# Patient Record
Sex: Female | Born: 1975 | Race: Black or African American | Hispanic: No | Marital: Single | Smoking: Never smoker
Health system: Southern US, Community
[De-identification: ages and names within clinical notes are randomized; demographics above are authoritative.]

## PROBLEM LIST (undated history)

## (undated) DIAGNOSIS — E079 Disorder of thyroid, unspecified: Secondary | ICD-10-CM

## (undated) DIAGNOSIS — K219 Gastro-esophageal reflux disease without esophagitis: Secondary | ICD-10-CM

## (undated) DIAGNOSIS — R51 Headache: Secondary | ICD-10-CM

## (undated) DIAGNOSIS — T7840XA Allergy, unspecified, initial encounter: Secondary | ICD-10-CM

## (undated) DIAGNOSIS — G8929 Other chronic pain: Secondary | ICD-10-CM

## (undated) DIAGNOSIS — R7303 Prediabetes: Secondary | ICD-10-CM

## (undated) DIAGNOSIS — Z8619 Personal history of other infectious and parasitic diseases: Secondary | ICD-10-CM

## (undated) DIAGNOSIS — D649 Anemia, unspecified: Secondary | ICD-10-CM

## (undated) DIAGNOSIS — M719 Bursopathy, unspecified: Secondary | ICD-10-CM

## (undated) HISTORY — DX: Other chronic pain: G89.29

## (undated) HISTORY — DX: Personal history of other infectious and parasitic diseases: Z86.19

## (undated) HISTORY — DX: Disorder of thyroid, unspecified: E07.9

## (undated) HISTORY — DX: Allergy, unspecified, initial encounter: T78.40XA

## (undated) HISTORY — PX: KELOID EXCISION: SHX1856

## (undated) HISTORY — DX: Headache: R51

## (undated) HISTORY — DX: Bursopathy, unspecified: M71.9

## (undated) HISTORY — DX: Prediabetes: R73.03

## (undated) HISTORY — DX: Gastro-esophageal reflux disease without esophagitis: K21.9

## (undated) HISTORY — DX: Anemia, unspecified: D64.9

---

## 2000-09-07 ENCOUNTER — Emergency Department (HOSPITAL_COMMUNITY): Admission: EM | Admit: 2000-09-07 | Discharge: 2000-09-07 | Payer: Self-pay | Admitting: *Deleted

## 2000-09-23 ENCOUNTER — Encounter: Payer: Self-pay | Admitting: Emergency Medicine

## 2000-09-23 ENCOUNTER — Emergency Department (HOSPITAL_COMMUNITY): Admission: EM | Admit: 2000-09-23 | Discharge: 2000-09-23 | Payer: Self-pay | Admitting: Internal Medicine

## 2004-12-31 ENCOUNTER — Ambulatory Visit: Payer: Self-pay | Admitting: Internal Medicine

## 2005-11-07 HISTORY — PX: BREAST REDUCTION SURGERY: SHX8

## 2005-11-10 ENCOUNTER — Ambulatory Visit (HOSPITAL_BASED_OUTPATIENT_CLINIC_OR_DEPARTMENT_OTHER): Admission: RE | Admit: 2005-11-10 | Discharge: 2005-11-11 | Payer: Self-pay | Admitting: Specialist

## 2005-11-10 ENCOUNTER — Encounter (INDEPENDENT_AMBULATORY_CARE_PROVIDER_SITE_OTHER): Payer: Self-pay | Admitting: Specialist

## 2006-01-12 ENCOUNTER — Encounter: Admission: RE | Admit: 2006-01-12 | Discharge: 2006-01-12 | Payer: Self-pay | Admitting: Emergency Medicine

## 2007-05-10 HISTORY — PX: FOOT SURGERY: SHX648

## 2007-05-24 ENCOUNTER — Ambulatory Visit (HOSPITAL_COMMUNITY): Admission: RE | Admit: 2007-05-24 | Discharge: 2007-05-24 | Payer: Self-pay | Admitting: Specialist

## 2008-02-10 ENCOUNTER — Emergency Department (HOSPITAL_BASED_OUTPATIENT_CLINIC_OR_DEPARTMENT_OTHER): Admission: EM | Admit: 2008-02-10 | Discharge: 2008-02-10 | Payer: Self-pay | Admitting: Emergency Medicine

## 2008-02-21 ENCOUNTER — Emergency Department (HOSPITAL_BASED_OUTPATIENT_CLINIC_OR_DEPARTMENT_OTHER): Admission: EM | Admit: 2008-02-21 | Discharge: 2008-02-21 | Payer: Self-pay | Admitting: Emergency Medicine

## 2008-08-04 ENCOUNTER — Ambulatory Visit: Payer: Self-pay | Admitting: *Deleted

## 2008-08-04 ENCOUNTER — Other Ambulatory Visit: Admission: RE | Admit: 2008-08-04 | Discharge: 2008-08-04 | Payer: Self-pay | Admitting: *Deleted

## 2008-08-04 ENCOUNTER — Encounter (INDEPENDENT_AMBULATORY_CARE_PROVIDER_SITE_OTHER): Payer: Self-pay | Admitting: *Deleted

## 2008-08-04 DIAGNOSIS — K921 Melena: Secondary | ICD-10-CM | POA: Insufficient documentation

## 2008-08-04 DIAGNOSIS — K219 Gastro-esophageal reflux disease without esophagitis: Secondary | ICD-10-CM

## 2008-08-04 DIAGNOSIS — J301 Allergic rhinitis due to pollen: Secondary | ICD-10-CM | POA: Insufficient documentation

## 2008-08-04 DIAGNOSIS — M76899 Other specified enthesopathies of unspecified lower limb, excluding foot: Secondary | ICD-10-CM

## 2008-08-04 DIAGNOSIS — D649 Anemia, unspecified: Secondary | ICD-10-CM | POA: Insufficient documentation

## 2008-08-04 DIAGNOSIS — K5900 Constipation, unspecified: Secondary | ICD-10-CM | POA: Insufficient documentation

## 2008-08-04 LAB — CONVERTED CEMR LAB
ALT: 15 units/L (ref 0–35)
Basophils Absolute: 0.1 10*3/uL (ref 0.0–0.1)
Basophils Relative: 1.8 % (ref 0.0–3.0)
CO2: 26 meq/L (ref 19–32)
Calcium: 9.2 mg/dL (ref 8.4–10.5)
Chloride: 105 meq/L (ref 96–112)
Cholesterol: 153 mg/dL (ref 0–200)
GFR calc Af Amer: 125 mL/min
LDL Cholesterol: 86 mg/dL (ref 0–99)
Lymphocytes Relative: 37.5 % (ref 12.0–46.0)
MCHC: 32.8 g/dL (ref 30.0–36.0)
Monocytes Relative: 7 % (ref 3.0–12.0)
Neutrophils Relative %: 49.7 % (ref 43.0–77.0)
RBC: 4.2 M/uL (ref 3.87–5.11)
RDW: 15 % — ABNORMAL HIGH (ref 11.5–14.6)
Sodium: 137 meq/L (ref 135–145)
Total CHOL/HDL Ratio: 2.8
Total Protein: 7.7 g/dL (ref 6.0–8.3)
VLDL: 12 mg/dL (ref 0–40)

## 2008-08-25 ENCOUNTER — Ambulatory Visit: Payer: Self-pay | Admitting: *Deleted

## 2008-08-29 ENCOUNTER — Telehealth (INDEPENDENT_AMBULATORY_CARE_PROVIDER_SITE_OTHER): Payer: Self-pay | Admitting: *Deleted

## 2008-08-30 ENCOUNTER — Encounter (INDEPENDENT_AMBULATORY_CARE_PROVIDER_SITE_OTHER): Payer: Self-pay | Admitting: *Deleted

## 2008-09-21 DIAGNOSIS — R51 Headache: Secondary | ICD-10-CM

## 2008-09-21 DIAGNOSIS — R519 Headache, unspecified: Secondary | ICD-10-CM | POA: Insufficient documentation

## 2008-09-26 ENCOUNTER — Encounter: Payer: Self-pay | Admitting: Internal Medicine

## 2008-11-25 ENCOUNTER — Ambulatory Visit (HOSPITAL_BASED_OUTPATIENT_CLINIC_OR_DEPARTMENT_OTHER): Admission: RE | Admit: 2008-11-25 | Discharge: 2008-11-25 | Payer: Self-pay | Admitting: Emergency Medicine

## 2009-02-27 ENCOUNTER — Ambulatory Visit: Payer: Self-pay | Admitting: Internal Medicine

## 2009-02-27 DIAGNOSIS — J018 Other acute sinusitis: Secondary | ICD-10-CM

## 2009-08-13 ENCOUNTER — Other Ambulatory Visit: Admission: RE | Admit: 2009-08-13 | Discharge: 2009-08-13 | Payer: Self-pay | Admitting: Internal Medicine

## 2009-08-13 ENCOUNTER — Ambulatory Visit: Payer: Self-pay | Admitting: Internal Medicine

## 2009-08-13 DIAGNOSIS — E669 Obesity, unspecified: Secondary | ICD-10-CM

## 2009-08-13 DIAGNOSIS — R439 Unspecified disturbances of smell and taste: Secondary | ICD-10-CM

## 2009-08-14 ENCOUNTER — Ambulatory Visit: Payer: Self-pay | Admitting: Diagnostic Radiology

## 2009-08-14 ENCOUNTER — Ambulatory Visit (HOSPITAL_BASED_OUTPATIENT_CLINIC_OR_DEPARTMENT_OTHER): Admission: RE | Admit: 2009-08-14 | Discharge: 2009-08-14 | Payer: Self-pay | Admitting: Internal Medicine

## 2009-08-14 ENCOUNTER — Encounter (INDEPENDENT_AMBULATORY_CARE_PROVIDER_SITE_OTHER): Payer: Self-pay | Admitting: *Deleted

## 2009-08-16 ENCOUNTER — Encounter: Payer: Self-pay | Admitting: Internal Medicine

## 2009-08-16 ENCOUNTER — Telehealth: Payer: Self-pay | Admitting: Internal Medicine

## 2009-08-31 ENCOUNTER — Encounter: Payer: Self-pay | Admitting: Internal Medicine

## 2009-09-07 ENCOUNTER — Encounter: Admission: RE | Admit: 2009-09-07 | Discharge: 2009-09-07 | Payer: Self-pay | Admitting: Otolaryngology

## 2010-02-13 ENCOUNTER — Ambulatory Visit: Payer: Self-pay | Admitting: Internal Medicine

## 2010-07-09 NOTE — Consult Note (Signed)
Summary: [facility]  [facility]   Imported By: Lanelle Bal 09/11/2009 12:46:38  _____________________________________________________________________  External Attachment:    Type:   Image     Comment:   External Document

## 2010-07-09 NOTE — Letter (Signed)
Summary: Primary Care Consult Scheduled Letter  [place] at [facility]  [address]   [place], [place] [address]   Phone: [phone]  Fax: [phone]      08/14/2009 MRN: [id number]  [facility] [place] [address] [place], [place]  [address]    Dear Ms. Elsberry,      We have scheduled an appointment for you.  At the recommendation of Dr._YOO, we have scheduled you a consult with [facility] , DR Conley Simmonds  on _[address]  at [facility] .  Their address is_[address], [place] . The office phone number is [phone].  If this appointment day and time is not convenient for you, please feel free to call the office of the doctor you are being referred to at the number listed above and reschedule the appointment.     It is important for you to keep your scheduled appointments. We are here to make sure you are given good patient care. If you have questions or you have made changes to your appointment, please notify us at  [phone], ask for HELEN.    Thank you,  Patient Care Coordinator [place] at [facility]

## 2010-07-09 NOTE — Progress Notes (Signed)
Summary: Lab Results   Phone Note Outgoing Call   Summary of Call: call pt - HIV is negative.   Please have pt complete regular stools cards re:  anemia.  285.9 Initial call taken by: D. Thomos Lemons DO,  August 16, 2009 4:27 PM  Follow-up for Phone Call        patient advised per Dr Artist Pais instructions, Stool card mailed to patient per her request Follow-up by: Glendell Docker CMA,  August 17, 2009 8:20 AM

## 2010-07-09 NOTE — Letter (Signed)
   [place] at [facility] [address] [place], [place]  [address]  [place] Phone: [phone]      August 16, 2009   [facility] [place] [address] [place], [place] [address]  RE:  LAB RESULTS  Dear  Ms. Lehrman,  The following is an interpretation of your most recent lab tests.  Please take note of any instructions provided or changes to medications that have resulted from your lab work.  Pap Smear: normal - follow up in 1 year  ELECTROLYTES:  Good - no changes needed  KIDNEY FUNCTION TESTS:  Good - no changes needed  LIVER FUNCTION TESTS:  Good - no changes needed  LIPID PANEL:  Good - no changes needed Triglyceride: 46   Cholesterol: 140   LDL: 70   HDL: 61   Chol/HDL%:  2.3 Ratio  THYROID STUDIES:  Thyroid studies normal TSH: 1.161     CBC:  Fair - review at your next visit  Please take over the counter  iron supplement daily.      Sincerely Yours,    Dr. Thomos Lemons

## 2010-07-09 NOTE — Assessment & Plan Note (Signed)
Summary: NEW TO EST.npx (FASTING )Dr. Andrey Campanile pt- jr   Vital Signs:  Patient profile:   35 year old female Height:      66.5 inches Weight:      255.75 pounds BMI:     40.81 O2 Sat:      100 % on Room air Temp:     98.8 degrees F oral Pulse rate:   81 / minute Pulse rhythm:   regular Resp:     18 per minute BP sitting:   108 / 72  (right arm) Cuff size:   large  Vitals Entered By: Glendell Docker CMA (August 13, 2009 11:06 AM)  O2 Flow:  Room air CC: CPX Is Patient Diabetic? No Comments refill on Tri Sprintec   Primary Care Provider:  Dondra Spry DO  CC:  CPX.  History of Present Illness: 35 y/o AA female for routine CPX.  works during day and goes to school at night gave up soft drinks x 1 month trying to cut out sweets and chips  she had sinus infection in 02/2009.  infection cleared up but she lost her sense of smell and it never returned to normal.  no current sinus symptoms.  no headaches. her sense of taste  also abnormal.    LMP 2 wks ago no hx of abnormal paps no vaginal symptoms hx of breast reduction.  chronic "lumpy" breasts  Preventive Screening-Counseling & Management  Alcohol-Tobacco     Alcohol drinks/day: 0     Smoking Status: never  Caffeine-Diet-Exercise     Caffeine use/day: 1-2 per week     Does Patient Exercise: no  Allergies (verified): No Known Drug Allergies  Past History:  Past Medical History: Current Problems:  ALLERGIC RHINITIS, SEASONAL (ICD-477.0) GERD (ICD-530.81) CHICKENPOX (ICD-052.9)   ANEMIA (ICD-285.9) - past history  Bursitis - bilateral hip R>L - sees ortho  (uses tramadol and ibuprofen constipation  Family History: Family History of Arthritis Family History Diabetes 1st degree relative - father Family History High cholesterol Family History Hypertension Family History of  CAD - father Family History of  "female  - GM had ovarian cancer No fam hx of breast cancer    Social History: Occupation:  Admission  service associate - [employer] Single lives with god sister no children Never Smoked  Alcohol use-no Caffeine use/day:  1-2 per week Does Patient Exercise:  no  Review of Systems       The patient complains of weight gain.  The patient denies chest pain, dyspnea on exertion, prolonged cough, abdominal pain, melena, hematochezia, severe indigestion/heartburn, genital sores, depression, and breast masses.    Physical Exam  General:  alert and overweight-appearing.   Head:  normocephalic and atraumatic.   Eyes:  pupils equal, pupils round, and pupils reactive to light.   Ears:  R ear normal and L ear normal.   Mouth:  Oral mucosa and oropharynx without lesions or exudates.  Teeth in good repair. Neck:  No deformities, masses, or tenderness noted.no carotid bruits.   Breasts:  skin/areolae normal and no abnormal thickening.  left  breast scarring left breast below nipple line (4 o clock position  Lungs:  normal respiratory effort, normal breath sounds, no crackles, and no wheezes.   Heart:  normal rate, regular rhythm, no murmur, and no gallop.   Abdomen:  soft, non-tender, normal bowel sounds, no hepatomegaly, and no splenomegaly.   Genitalia:  normal introitus, no vaginal discharge, and mucosa pink and moist.  Extremities:  trace left pedal edema and trace right pedal edema.   Neurologic:  cranial nerves II-XII intact and gait normal.   Psych:  normally interactive, good eye contact, not anxious appearing, and not depressed appearing.     Impression & Recommendations:  Problem # 1:  PREVENTIVE HEALTH CARE (ICD-V70.0) Reviewed adult health maintenance protocols.   We discussed wt loss strategies. routine PAP and Pelvic performed. hx of breast reduction.  I suspect breast tissue has scarring.  arrange baseline mammogram  Orders: T-HIV Antibody  (Reflex) ([id number]) Mammogram (Screening) (Mammo)  Pap smear: NEGATIVE FOR INTRAEPITHELIAL LESIONS OR MALIGNANCY. (08/04/2008) Td  Booster: Tdap (10/15/2004)   Flu Vax: Historical (03/27/2009)   Chol: 153 (08/04/2008)   HDL: 55.0 (08/04/2008)   LDL: 86 (08/04/2008)   TG: 61 (08/04/2008) TSH: 1.02 (08/04/2008)     Problem # 2:  ANOSMIA (ICD-781.1)  she has hx of sinusitis in 02/2009.  since then decreased sense of smell.  refer to ENT for further eval  Orders: ENT Referral (ENT)  Complete Medication List: 1)  [facility] Ibuprofen 200 Mg Caps (Ibuprofen) .... As needed 2)  Claritin 10 Mg Tabs (Loratadine) .... One tab by mouth once daily as needed 3)  Tri-sprintec 0.035 Mg Tabs (Norgestimate-ethinyl estradiol) .... As directed 4)  Ferrous Sulfate 325 (65 Fe) Mg Tabs (Ferrous sulfate) .... One each day of the over the counter supplement - no prescription needed 5)  Tramadol Hcl 50 Mg Tabs (Tramadol hcl) .Marland Kitchen.. 1-2 by mouth three times daily 6)  Colace 100 Mg Caps (Docusate sodium) .... Take 1 capsule by mouth once a day as needed  Other Orders: T-Basic Metabolic Panel [id number]) T-Hepatic Function [id number]) T-Lipid Profile [id number]) T-CBC No Diff ([id number]) T-TSH ([id number])  Patient Instructions: 1)  Please schedule a follow-up appointment in 6 months. 2)  [email or web address] 3)  Limit your calories to 1300 -1400 cal per day 4)  Use metamucile or citrucelle once daily Prescriptions: TRI-SPRINTEC 0.035 MG TABS (NORGESTIMATE-ETHINYL ESTRADIOL) as directed  #1 x 11   Entered and Authorized by:   D. Thomos Lemons DO   Signed by:   D. Thomos Lemons DO on 08/13/2009   Method used:   Electronically to        [facility] [phone]* (retail)       [address]       [place], [place]  [address]       Ph: [phone]       Fax: [phone]   RxID:   [id number]   Current Allergies (reviewed today): No known allergies       Immunization History:  Influenza Immunization History:    Influenza:  historical (03/27/2009)

## 2010-07-31 ENCOUNTER — Telehealth: Payer: Self-pay | Admitting: Internal Medicine

## 2010-08-15 NOTE — Progress Notes (Signed)
Summary: cpe on 3.19.12. last lab work  Phone Note Call from Patient   Caller: Patient Call For: nurse Summary of Call: pt made appt for cpe on 3.19.12 at 8:30. pt will be fasting that day. last lab work was on 3.7.11 bmp, lipid panel, liver profile, tsh, and HIV.  Initial call taken by: Elba Barman,  July 31, 2010 2:20 PM  Follow-up for Phone Call        I suggest CBC - use anemia code FLP, BMET - v70 A1c - use fam hx of DM II, and obesity code Follow-up by: D. Thomos Lemons DO,  August 05, 2010 5:10 PM  Additional Follow-up for Phone Call Additional follow up Details #1::        Labs have been entered for the week of March 12th  for [facility] Additional Follow-up by: Glendell Docker CMA,  August 06, 2010 8:39 AM

## 2010-08-19 ENCOUNTER — Encounter: Payer: Self-pay | Admitting: Internal Medicine

## 2010-08-26 ENCOUNTER — Encounter: Payer: Self-pay | Admitting: Internal Medicine

## 2010-08-27 ENCOUNTER — Encounter: Payer: Self-pay | Admitting: Internal Medicine

## 2010-09-16 LAB — URINALYSIS, ROUTINE W REFLEX MICROSCOPIC
Glucose, UA: NEGATIVE mg/dL
Ketones, ur: 15 mg/dL — AB
Protein, ur: 30 mg/dL — AB

## 2010-09-16 LAB — URINE MICROSCOPIC-ADD ON

## 2010-09-24 ENCOUNTER — Encounter: Payer: Self-pay | Admitting: Family

## 2010-09-24 ENCOUNTER — Ambulatory Visit (INDEPENDENT_AMBULATORY_CARE_PROVIDER_SITE_OTHER): Payer: 59 | Admitting: Internal Medicine

## 2010-09-24 ENCOUNTER — Other Ambulatory Visit: Payer: Self-pay | Admitting: Internal Medicine

## 2010-09-24 ENCOUNTER — Encounter: Payer: Self-pay | Admitting: Internal Medicine

## 2010-09-24 ENCOUNTER — Other Ambulatory Visit (HOSPITAL_COMMUNITY)
Admission: RE | Admit: 2010-09-24 | Discharge: 2010-09-24 | Disposition: A | Discharge status: Home / Self Care | Payer: 59 | Source: Ambulatory Visit | Attending: Internal Medicine | Admitting: Internal Medicine

## 2010-09-24 DIAGNOSIS — Z01419 Encounter for gynecological examination (general) (routine) without abnormal findings: Secondary | ICD-10-CM | POA: Insufficient documentation

## 2010-09-24 DIAGNOSIS — D649 Anemia, unspecified: Secondary | ICD-10-CM

## 2010-09-24 DIAGNOSIS — Z0000 Encounter for general adult medical examination without abnormal findings: Secondary | ICD-10-CM

## 2010-09-24 DIAGNOSIS — R102 Pelvic and perineal pain unspecified side: Secondary | ICD-10-CM

## 2010-09-24 DIAGNOSIS — N949 Unspecified condition associated with female genital organs and menstrual cycle: Secondary | ICD-10-CM

## 2010-09-24 DIAGNOSIS — Z124 Encounter for screening for malignant neoplasm of cervix: Secondary | ICD-10-CM

## 2010-09-24 DIAGNOSIS — R7309 Other abnormal glucose: Secondary | ICD-10-CM

## 2010-09-24 DIAGNOSIS — R439 Unspecified disturbances of smell and taste: Secondary | ICD-10-CM

## 2010-09-24 LAB — CBC WITH DIFFERENTIAL/PLATELET
Eosinophils Relative: 3 % (ref 0–5)
HCT: 35.2 % — ABNORMAL LOW (ref 36.0–46.0)
Lymphocytes Relative: 41 % (ref 12–46)
Lymphs Abs: 2 10*3/uL (ref 0.7–4.0)
MCV: 83.8 fL (ref 78.0–100.0)
Monocytes Absolute: 0.4 10*3/uL (ref 0.1–1.0)
Neutro Abs: 2.5 10*3/uL (ref 1.7–7.7)
Platelets: 329 10*3/uL (ref 150–400)
RBC: 4.2 MIL/uL (ref 3.87–5.11)
WBC: 5 10*3/uL (ref 4.0–10.5)

## 2010-09-24 LAB — HEMOGLOBIN A1C: Mean Plasma Glucose: 128 mg/dL — ABNORMAL HIGH (ref <117)

## 2010-09-24 NOTE — Patient Instructions (Signed)
Our office will contact you re:  PAP and blood test results Avoid sweets and limit your carbohydrate intake to 25 grams per meal. Try to exercise daily - 30-45 minutes.

## 2010-09-24 NOTE — Progress Notes (Signed)
  Subjective:    Patient ID: Natasha Hall, female    DOB: [birth year], 35 y.o.   MRN: [id number]  HPI  35 y/o AA female with hx of presumed iron def anemia for routine cpx. Menstrual cycles 6-7 days.  Bleeding can be heavy.  Has not been taking iron due to constipation.  Stool color is normal   Right pelvic pain before menstrual cycle 2-3 days.  No urinary complaints.  Grandmother has hx of ovarian cancer.  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  Father was diabetic.   Review of Systems   Constitutional: Negative for activity change, appetite change and unexpected weight change.  Eyes: Negative for visual disturbance.  Respiratory: Negative for cough, chest tightness and shortness of breath.   Cardiovascular: Negative for chest pain.  Genitourinary: Negative for difficulty urinating.  Neurological: Negative for headaches.  Gastrointestinal: Negative for abdominal pain, heartburn melena or hematochezia Psych: Negative for depression or anxiety Endo:  No polyuria or polydypsia        Objective:   Physical Exam  Constitutional: She is oriented to person, place, and time.       Pleasant, obese 35 y/o female.  Appears stated age  HENT:  Head: Normocephalic and atraumatic.  Right Ear: External ear normal.  Left Ear: External ear normal.  Mouth/Throat: Oropharynx is clear and moist.  Eyes: Conjunctivae are normal. Pupils are equal, round, and reactive to light.  Neck: Normal range of motion. Neck supple. No thyromegaly present.  Cardiovascular: Normal rate, regular rhythm and normal heart sounds.  Exam reveals no gallop and no friction rub.   No murmur heard. Pulmonary/Chest: Effort normal and breath sounds normal. She has no wheezes. She has no rales.  Abdominal: Soft. She exhibits no distension and no mass.       Right pelvic tenderness  Genitourinary: Vagina normal and uterus normal. No vaginal discharge found.  Lymphadenopathy:    She has no cervical  adenopathy.  Neurological: She is alert and oriented to person, place, and time. No cranial nerve deficit.  Skin: Skin is warm and dry.  Psychiatric: She has a normal mood and affect. Her behavior is normal.          Assessment & Plan:

## 2010-09-25 LAB — IBC PANEL
%SAT: 23 % (ref 20–55)
TIBC: 380 ug/dL (ref 250–470)
UIBC: 292 ug/dL

## 2010-09-27 ENCOUNTER — Telehealth: Payer: Self-pay | Admitting: Internal Medicine

## 2010-09-27 ENCOUNTER — Inpatient Hospital Stay (HOSPITAL_BASED_OUTPATIENT_CLINIC_OR_DEPARTMENT_OTHER): Admission: RE | Admit: 2010-09-27 | Payer: 59 | Source: Ambulatory Visit

## 2010-09-27 ENCOUNTER — Other Ambulatory Visit (HOSPITAL_BASED_OUTPATIENT_CLINIC_OR_DEPARTMENT_OTHER): Payer: 59

## 2010-09-27 DIAGNOSIS — R7309 Other abnormal glucose: Secondary | ICD-10-CM

## 2010-09-27 DIAGNOSIS — D649 Anemia, unspecified: Secondary | ICD-10-CM

## 2010-09-27 LAB — HEMOGLOBINOPATHY EVALUATION
Hemoglobin Other: 0 % (ref 0.0–0.0)
Hgb F Quant: 0.3 % (ref 0.0–2.0)

## 2010-09-27 NOTE — Telephone Encounter (Signed)
Call pt - blood tests shows pt is borderline diabetic I suggest dietary/lifestyle mgt for now.   (avoid concentrated sweets, decrease starchy foods and exercise regularly) Pt is mildly anemia.  Serum iron is low normal.  She does not qualify for IV iron. Pt can pick up samples of prescription iron to try.  I suggest repeat labs in 6 months A1c - 790.29 CBC - 285.9

## 2010-09-30 DIAGNOSIS — Z124 Encounter for screening for malignant neoplasm of cervix: Secondary | ICD-10-CM | POA: Insufficient documentation

## 2010-09-30 NOTE — Telephone Encounter (Signed)
I suggest referral to diabetic educator regarding borderline type 2 diabetes Please take iron supplement x2 months. Repeat CBC and iron studies in 2 months -  use iron deficiency anemia code

## 2010-09-30 NOTE — Assessment & Plan Note (Signed)
Unexplained right pelvic pain.  Difficult to assess for ovarian abnormality on physical exam due to obesity Obtain pelvic ultrasound

## 2010-09-30 NOTE — Assessment & Plan Note (Signed)
She was evaluated by ENT. MRI of brain reported normal

## 2010-09-30 NOTE — Assessment & Plan Note (Signed)
Dietary counseling provided Monitor A1c

## 2010-09-30 NOTE — Telephone Encounter (Signed)
Patient returned phone call and left voice message to have her call returned. She stated that she needed a better understanding on what concentrated sweets she needed to avoid, and how long should she take the iron supplement , before she should request a rx.

## 2010-09-30 NOTE — Assessment & Plan Note (Addendum)
GU exam normal Routine PAP sent

## 2010-09-30 NOTE — Assessment & Plan Note (Signed)
Probable iron def. anemia Check iron studies, B12 Consider check hemoglobin electrophoresis

## 2010-09-30 NOTE — Telephone Encounter (Signed)
Call placed to patient at [phone], no answer. A detailed voice message was left informing patient  Per Dr Artist Pais instructions. Message was left for patient to return call if any questions. Samples of Integra Plus left at front desk for patient pick up

## 2010-10-01 ENCOUNTER — Other Ambulatory Visit (HOSPITAL_BASED_OUTPATIENT_CLINIC_OR_DEPARTMENT_OTHER): Payer: Self-pay

## 2010-10-01 NOTE — Telephone Encounter (Signed)
Call placed to patient at [phone], no answer. A detailed voice message was left informing patient per Dr Artist Pais instructions. She was advised to call back in one week if she has not heard from office office regarding the referral to the Diabetic Educator

## 2010-10-03 ENCOUNTER — Ambulatory Visit (HOSPITAL_BASED_OUTPATIENT_CLINIC_OR_DEPARTMENT_OTHER)
Admission: RE | Admit: 2010-10-03 | Discharge: 2010-10-03 | Disposition: A | Discharge status: Home / Self Care | Payer: 59 | Source: Ambulatory Visit | Attending: Internal Medicine | Admitting: Internal Medicine

## 2010-10-03 ENCOUNTER — Inpatient Hospital Stay (HOSPITAL_BASED_OUTPATIENT_CLINIC_OR_DEPARTMENT_OTHER): Admission: RE | Admit: 2010-10-03 | Payer: Self-pay | Source: Ambulatory Visit

## 2010-10-03 ENCOUNTER — Other Ambulatory Visit: Payer: Self-pay | Admitting: Internal Medicine

## 2010-10-03 ENCOUNTER — Inpatient Hospital Stay (HOSPITAL_BASED_OUTPATIENT_CLINIC_OR_DEPARTMENT_OTHER): Admission: RE | Admit: 2010-10-03 | Payer: 59 | Source: Ambulatory Visit

## 2010-10-03 ENCOUNTER — Ambulatory Visit (INDEPENDENT_AMBULATORY_CARE_PROVIDER_SITE_OTHER)
Admission: RE | Admit: 2010-10-03 | Discharge: 2010-10-03 | Disposition: A | Discharge status: Home / Self Care | Payer: 59 | Source: Ambulatory Visit | Attending: Internal Medicine | Admitting: Internal Medicine

## 2010-10-03 DIAGNOSIS — R1031 Right lower quadrant pain: Secondary | ICD-10-CM

## 2010-10-03 DIAGNOSIS — R7309 Other abnormal glucose: Secondary | ICD-10-CM

## 2010-10-03 DIAGNOSIS — R102 Pelvic and perineal pain: Secondary | ICD-10-CM

## 2010-10-03 DIAGNOSIS — R439 Unspecified disturbances of smell and taste: Secondary | ICD-10-CM

## 2010-10-03 DIAGNOSIS — Z124 Encounter for screening for malignant neoplasm of cervix: Secondary | ICD-10-CM

## 2010-10-03 DIAGNOSIS — Z8041 Family history of malignant neoplasm of ovary: Secondary | ICD-10-CM | POA: Insufficient documentation

## 2010-10-03 DIAGNOSIS — D649 Anemia, unspecified: Secondary | ICD-10-CM

## 2010-10-03 DIAGNOSIS — N949 Unspecified condition associated with female genital organs and menstrual cycle: Secondary | ICD-10-CM

## 2010-10-03 DIAGNOSIS — R1011 Right upper quadrant pain: Secondary | ICD-10-CM | POA: Insufficient documentation

## 2010-10-11 ENCOUNTER — Other Ambulatory Visit: Payer: Self-pay | Admitting: Internal Medicine

## 2010-10-11 DIAGNOSIS — D649 Anemia, unspecified: Secondary | ICD-10-CM

## 2010-10-11 LAB — POC HEMOCCULT BLD/STL (HOME/3-CARD/SCREEN)
Card #2 Fecal Occult Blod, POC: NEGATIVE
Card #3 Fecal Occult Blood, POC: NEGATIVE
Fecal Occult Blood, POC: NEGATIVE

## 2010-10-22 NOTE — Op Note (Signed)
Natasha Hall, Natasha Hall               ACCOUNT NO.:  [id number]   MEDICAL RECORD NO.:  [id number]        PATIENT TYPE:  LAMB   LOCATION:                                FACILITY:  [facility]   PHYSICIAN:  Jene Every, M.D.    DATE OF BIRTH:  November 27, 1975   DATE OF PROCEDURE:  05/24/2007  DATE OF DISCHARGE:                               OPERATIVE REPORT   PREOPERATIVE DIAGNOSIS:  Retained foreign body left foot.   POSTOPERATIVE DIAGNOSIS:  Retained foreign body left foot.   PROCEDURE PERFORMED:  Open removal of retained foreign body the left  foot.   ANESTHESIA:  General.   BRIEF HISTORY INDICATION:  This is very pleasant young female of 35  years of age, who on December 10 had stepped on a sewing needle.  It  broke off into her foot. Seen at urgent care, x-rays were taken  demonstrating it being lodged in the subcutaneous tissue approaching the  fascia of the foot.  She was indicated for removal under anesthesia.  She had been placed on Keflex.  Risks and benefits discussed including  bleeding, infection, inability to remove the needle, etc.   The patient in supine position after induction of adequate general  anesthesia 1 gram Kefzol the left foot was prepped and draped in usual  sterile fashion.  Incision was then made over the lateral aspect of the  plantar surface directly in line with the entrance point of the sewing  needle.  Approximately a centimeter in length was utilized.  We used  some subcutaneous Marcaine.  We bluntly dissected to the needle track.  It was not immediately evident and therefore placed two additional  needles for triangulation in the AP and lateral plane over top of the  needle.  We bluntly dissected down to the fascia.  It did not penetrate  the fascia.  The needle was identified, removed in its entirety.  We  copiously irrigated the wound.  X-rays following this revealed absence  of the needle foreign body.  Then closed the wound with 3-0 nylon  vertical  mattress sutures.  The wound was dressed sterilely, secure with  an Ace bandage.  She was extubated without difficulty and transported to  recovery in satisfactory condition.   The patient tolerated the procedure well, no complications.      Jene Every, M.D.  Electronically Signed     JB/MEDQ  D:  07/05/2007  T:  07/06/2007  Job:  [id number]

## 2010-10-25 NOTE — Op Note (Signed)
NAMEINGRIS, PASQUARELLA               ACCOUNT NO.:  [id number]   MEDICAL RECORD NO.:  [id number]          PATIENT TYPE:  AMB   LOCATION:  [facility]                          FACILITY:  [facility]   PHYSICIAN:  Earvin Hansen L. Shon Hough, M.D.DATE OF BIRTH:  07-Feb-1976   DATE OF PROCEDURE:  11/10/2005  DATE OF DISCHARGE:                                 OPERATIVE REPORT   A 35 year old lady with severe, severe macromastia.  She wears a size G bra.  The left side is greater than the right side.  Increased intertriginous  changes, pitting of both shoulders, and severe back shoulder pain secondary  to the above.   PROCEDURES DONE:  Bilateral breast reductions using the inferior pedicle  technique.   ANESTHESIA:  General.   PREOPERATIVE:  The patient is set up and drawn for the reduction  mammoplasty.  Using inferior pedicle technique, lifting the nipple-areolar  complexes back up from over 40 cm to 25 cm from the suprasternal notch, she  then underwent general anesthesia, intubated orally.  A prep was done to the  chest, breast areas in the routine fashion using Betadine soap and solution,  walled off with sterile towels and drapes so as to make a sterile field.  Xylocaine 0.25% with epinephrine 1:400,000 concentration was injected  locally, 200 mL per side.  After that this setup, the wounds were then  scored with a #15-blade.  The skin over the inferior pedicles de-  epithelialized with a #20 blade.  Medial and lateral fatty dermal pedicles  were excised down to underlying fascia.  The new keyhole area was also  debulked.  Hemostasis was maintained with Bovie unit on coagulation  throughout the area.  Laterally, more accessory breast tissues are removed  sharply using the Bovie unit.  After being happy with the excisions, the  flaps are then stayed back together with 3-0 Prolene suture.  Subcutaneous  closure is done with 2-0 and 3-0 Monocryl x2 layers and then a running  subcuticular stitch of 3-0  Monocryl and 5-0 Monocryl throughout the inverted  T.  The wounds were drained with a #10 fully-fluted Blake drain which is  placed in the depths of the wounds and brought out through the lateral-most  portion of the incision and secured with 6-0 Prolene.  The wounds are  cleansed.  Steri-Strips are applied to all of the areas.  Soft tissue  dressings including 4 x 4's, ABDs, Hypafix, Kerlix wraps.  The same  procedure is carried out on both sides.  We removed over 2655 g on the left  side and 2099 g on the right. The patient tolerated all of the procedures  very well, was taken to recovery in stable and excellent condition.   ESTIMATED BLOOD LOSS:  Less than 200 mL.   COMPLICATIONS:  None.      Yaakov Guthrie. Shon Hough, M.D.  Electronically Signed     GLT/MEDQ  D:  11/10/2005  T:  11/11/2005  Job:  [id number]

## 2011-02-14 ENCOUNTER — Ambulatory Visit (INDEPENDENT_AMBULATORY_CARE_PROVIDER_SITE_OTHER): Payer: 59 | Admitting: Family

## 2011-02-14 ENCOUNTER — Encounter: Payer: Self-pay | Admitting: Family

## 2011-02-14 ENCOUNTER — Ambulatory Visit (HOSPITAL_BASED_OUTPATIENT_CLINIC_OR_DEPARTMENT_OTHER)
Admission: RE | Admit: 2011-02-14 | Discharge: 2011-02-14 | Disposition: A | Discharge status: Home / Self Care | Payer: 59 | Source: Ambulatory Visit | Attending: Family | Admitting: Family

## 2011-02-14 ENCOUNTER — Ambulatory Visit: Payer: 59 | Admitting: Internal Medicine

## 2011-02-14 VITALS — BP 114/78 | HR 78 | Temp 98.3°F | Resp 18 | Ht 66.5 in | Wt 239.1 lb

## 2011-02-14 DIAGNOSIS — R221 Localized swelling, mass and lump, neck: Secondary | ICD-10-CM

## 2011-02-14 DIAGNOSIS — R220 Localized swelling, mass and lump, head: Secondary | ICD-10-CM | POA: Insufficient documentation

## 2011-02-14 DIAGNOSIS — Z0189 Encounter for other specified special examinations: Secondary | ICD-10-CM

## 2011-02-14 DIAGNOSIS — E049 Nontoxic goiter, unspecified: Secondary | ICD-10-CM

## 2011-02-14 DIAGNOSIS — M542 Cervicalgia: Secondary | ICD-10-CM

## 2011-02-14 LAB — POCT URINE PREGNANCY: Preg Test, Ur: NEGATIVE

## 2011-02-14 MED ORDER — AMOXICILLIN-POT CLAVULANATE 875-125 MG PO TABS: 1.0000 | ORAL_TABLET | Freq: Two times a day (BID) | ORAL | Status: AC

## 2011-02-14 MED ORDER — IOHEXOL 300 MG/ML  SOLN
75.0000 mL | Freq: Once | INTRAMUSCULAR | Status: AC | PRN
  Administered 2011-02-14: 75 mL via INTRAVENOUS

## 2011-02-14 NOTE — Patient Instructions (Signed)
Please complete your lab work and CT on the first floor. We will contact you with results.

## 2011-02-14 NOTE — Progress Notes (Signed)
Subjective:    Patient ID: Natasha Hall, female    DOB: 08-Mar-1976, 35 y.o.   MRN: [id number]  HPI  Natasha Hall is a 35 yr old female who presents today with chief complaint of ear pain.  Symptoms started 2 days ago and are described as severe.  Yesterday left ear was hurting, now right ear is hurting.  Has associated sore throat.  Denies associated fever.  Has had sick contacts at her office.  She reports HA for 10 days which is located behind her eyes.  No significant nasal drainage.    Review of Systems See HPI  Past Medical History  Diagnosis Date  . Allergy     seasonal allergic rhinitis  . GERD (gastroesophageal reflux disease)   . Anemia     past history  . History of chicken pox   . Bursitis     bilateral hip R>L- sees ortho (uses tramadol & ibuprofen)  . Constipation     History   Social History  . Marital Status: Single    Spouse Name: N/A    Number of Children: 0  . Years of Education: N/A   Occupational History  . Admission Service Associate     [employer]   Social History Main Topics  . Smoking status: Never Smoker   . Smokeless tobacco: Not on file  . Alcohol Use: No  . Drug Use: Not on file  . Sexually Active: Not on file   Other Topics Concern  . Not on file   Social History Narrative   Lives with god sister    Past Surgical History  Procedure Date  . Breast reduction surgery 11/2005  . Foot surgery 05/2007    left foot    Family History  Problem Relation Age of Onset  . Arthritis    . Diabetes Father   . Hyperlipidemia    . Hypertension    . Coronary artery disease Father   . Ovarian cancer      No Known Allergies  Current Outpatient Prescriptions on File Prior to Visit  Medication Sig Dispense Refill  . docusate sodium (COLACE) 100 MG capsule Take 100 mg by mouth daily as needed.        . ferrous sulfate 325 (65 FE) MG tablet Take 325 mg by mouth daily.        Marland Kitchen ibuprofen (ADVIL,MOTRIN) 200 MG tablet Take 200 mg by mouth as  needed.        . loratadine (CLARITIN) 10 MG tablet Take 10 mg by mouth daily as needed.          BP 114/78  Pulse 78  Temp(Src) 98.3 F (36.8 C) (Oral)  Resp 18  Ht 5' 6.5" (1.689 m)  Wt 239 lb 1.9 oz (108.464 kg)  BMI 38.02 kg/m2       Objective:   Physical Exam  Constitutional: She is oriented to person, place, and time. She appears well-developed and well-nourished.  HENT:  Head: Normocephalic and atraumatic.  Mouth/Throat: Oropharynx is clear and moist. No oropharyngeal exudate, posterior oropharyngeal edema, posterior oropharyngeal erythema or tonsillar abscesses.       Bilateral serous effusions without bulging or erythema.   Eyes: Conjunctivae are normal.  Neck:       Tender right sided neck fullness.  No significant cervical LAD  Cardiovascular: Normal rate and regular rhythm.   No murmur heard. Pulmonary/Chest: Effort normal and breath sounds normal. No respiratory distress. She has no wheezes. She  has no rales. She exhibits no tenderness.  Musculoskeletal: She exhibits no edema.  Neurological: She is alert and oriented to person, place, and time.  Skin: Skin is warm and dry.  Psychiatric: She has a normal mood and affect. Her behavior is normal. Judgment and thought content normal.          Assessment & Plan:

## 2011-02-16 ENCOUNTER — Telehealth: Payer: Self-pay | Admitting: Family

## 2011-02-16 DIAGNOSIS — E049 Nontoxic goiter, unspecified: Secondary | ICD-10-CM

## 2011-02-16 NOTE — Telephone Encounter (Signed)
Please call patient and let her know that her TSH which the thyroid hormone test in normal.  I would like for her to complete an ultrasound of her thyroid to further evaluate please.

## 2011-02-17 DIAGNOSIS — E049 Nontoxic goiter, unspecified: Secondary | ICD-10-CM | POA: Insufficient documentation

## 2011-02-17 NOTE — Assessment & Plan Note (Signed)
Due to right sided neck tenderness and enlargement, CT neck was performed: Prominent enlargement the right thyroid lobe with stellate  internal low density.  TSH is normal, reviewed case with Dr. Rodena Medin.  Will refer pt for ultrasound of thyroid to further evaluate.  Will also continue to treat her empirically with Augmentin for early OM and empiric coverage of her thyroid in rare chance of infectious process.  I did call the patient on Friday evening 9/7 and discuss the findings of the CT with her.

## 2011-02-17 NOTE — Telephone Encounter (Signed)
Pt.notified

## 2011-02-18 ENCOUNTER — Ambulatory Visit (HOSPITAL_BASED_OUTPATIENT_CLINIC_OR_DEPARTMENT_OTHER)
Admission: RE | Admit: 2011-02-18 | Discharge: 2011-02-18 | Disposition: A | Discharge status: Home / Self Care | Payer: 59 | Source: Ambulatory Visit | Attending: Family | Admitting: Family

## 2011-02-18 DIAGNOSIS — E049 Nontoxic goiter, unspecified: Secondary | ICD-10-CM | POA: Insufficient documentation

## 2011-02-19 ENCOUNTER — Telehealth: Payer: Self-pay | Admitting: Family

## 2011-02-19 DIAGNOSIS — E079 Disorder of thyroid, unspecified: Secondary | ICD-10-CM

## 2011-02-19 NOTE — Telephone Encounter (Signed)
Spoke with pt re: thyroid mass and need for biopsy.  She has seen Dr. Lazarus Salines in the past and wishes to return to him.  Will arrange.

## 2011-02-20 ENCOUNTER — Other Ambulatory Visit (HOSPITAL_COMMUNITY)
Admission: RE | Admit: 2011-02-20 | Discharge: 2011-02-20 | Disposition: A | Discharge status: Home / Self Care | Payer: 59 | Source: Ambulatory Visit | Attending: Otolaryngology | Admitting: Otolaryngology

## 2011-02-20 ENCOUNTER — Other Ambulatory Visit: Payer: Self-pay | Admitting: Otolaryngology

## 2011-02-20 DIAGNOSIS — E049 Nontoxic goiter, unspecified: Secondary | ICD-10-CM | POA: Insufficient documentation

## 2011-03-12 LAB — CBC
Hemoglobin: 12
MCHC: 33.4
MCV: 82.5
RDW: 14.3

## 2011-03-12 LAB — URINALYSIS, ROUTINE W REFLEX MICROSCOPIC
Bilirubin Urine: NEGATIVE
Nitrite: NEGATIVE
Specific Gravity, Urine: 1.03
Urobilinogen, UA: 0.2
pH: 5.5

## 2011-03-12 LAB — COMPREHENSIVE METABOLIC PANEL
ALT: 24
BUN: 7
CO2: 26
Calcium: 9.7
Creatinine, Ser: 0.8
GFR calc non Af Amer: >60
Glucose, Bld: 134 — ABNORMAL HIGH
Sodium: 139
Total Protein: 8

## 2011-03-12 LAB — URINE CULTURE: Colony Count: >=100000

## 2011-03-12 LAB — DIFFERENTIAL
Lymphocytes Relative: 29
Lymphs Abs: 1.8
Monocytes Relative: 7
Neutro Abs: 4.1
Neutrophils Relative %: 62

## 2011-03-12 LAB — LIPASE, BLOOD: Lipase: 29

## 2011-03-12 LAB — PREGNANCY, URINE: Preg Test, Ur: NEGATIVE

## 2011-03-14 LAB — COMPREHENSIVE METABOLIC PANEL
AST: 20
BUN: 4 — ABNORMAL LOW
CO2: 25
Calcium: 8.8
Chloride: 103
Creatinine, Ser: 0.69
GFR calc Af Amer: >60
GFR calc non Af Amer: >60
Total Bilirubin: 0.4

## 2011-03-14 LAB — CBC
HCT: 32.5 — ABNORMAL LOW
MCHC: 33.7
MCV: 82.2
Platelets: 361
RBC: 3.96
WBC: 5.5

## 2011-03-17 ENCOUNTER — Ambulatory Visit: Payer: 59 | Admitting: Internal Medicine

## 2011-03-31 ENCOUNTER — Other Ambulatory Visit: Payer: Self-pay | Admitting: Otolaryngology

## 2011-03-31 DIAGNOSIS — D497 Neoplasm of unspecified behavior of endocrine glands and other parts of nervous system: Secondary | ICD-10-CM

## 2011-04-04 ENCOUNTER — Ambulatory Visit (INDEPENDENT_AMBULATORY_CARE_PROVIDER_SITE_OTHER): Payer: 59 | Admitting: Internal Medicine

## 2011-04-04 ENCOUNTER — Encounter: Payer: Self-pay | Admitting: Internal Medicine

## 2011-04-04 DIAGNOSIS — R739 Hyperglycemia, unspecified: Secondary | ICD-10-CM

## 2011-04-04 DIAGNOSIS — D649 Anemia, unspecified: Secondary | ICD-10-CM

## 2011-04-04 DIAGNOSIS — E049 Nontoxic goiter, unspecified: Secondary | ICD-10-CM

## 2011-04-04 DIAGNOSIS — R7309 Other abnormal glucose: Secondary | ICD-10-CM

## 2011-04-04 LAB — BASIC METABOLIC PANEL
BUN: 10 mg/dL (ref 6–23)
CO2: 22 mEq/L (ref 19–32)
Chloride: 104 mEq/L (ref 96–112)
Glucose, Bld: 81 mg/dL (ref 70–99)
Potassium: 4.1 mEq/L (ref 3.5–5.3)
Sodium: 140 mEq/L (ref 135–145)

## 2011-04-04 LAB — CBC
HCT: 33.5 % — ABNORMAL LOW (ref 36.0–46.0)
Hemoglobin: 11.1 g/dL — ABNORMAL LOW (ref 12.0–15.0)
RBC: 3.98 MIL/uL (ref 3.87–5.11)

## 2011-04-04 NOTE — Progress Notes (Signed)
  Subjective:    Patient ID: Natasha Hall, female    DOB: [birth year], 35 y.o.   MRN: [id number]  HPI Pt presents to clinic for followup of multiple medical problems. Recently noted right goiter evaluated with neck ct and [place]. S/p ENT evaluation and bx reviewed as goiter without evidence of malignancy. TSH nl 9/12. States ENT recommended q6 month TFT and they will perform serial thyroid US.  H/o mild hyperglycemia a1c 6.1 and is working on losing wt. Wt down 11 lbs since last visit in sept. H/o mild anemia thought to be secondary to heavy periods. No other complaints.  Past Medical History  Diagnosis Date  . Allergy     seasonal allergic rhinitis  . GERD (gastroesophageal reflux disease)   . Anemia     past history  . History of chicken pox   . Bursitis     bilateral hip R>L- sees ortho (uses tramadol & ibuprofen)  . Constipation    Past Surgical History  Procedure Date  . Breast reduction surgery 11/2005  . Foot surgery 05/2007    left foot    reports that she has never smoked. She does not have any smokeless tobacco history on file. She reports that she does not drink alcohol. Her drug history not on file. family history includes Arthritis in an unspecified family member; Coronary artery disease in her father; Diabetes in her father; Hyperlipidemia in an unspecified family member; Hypertension in an unspecified family member; and Ovarian cancer in an unspecified family member. No Known Allergies   Review of Systems see hpi     Objective:   Physical Exam  Nursing note and vitals reviewed. Constitutional: She appears well-developed and well-nourished. No distress.  HENT:  Head: Normocephalic and atraumatic.  Right Ear: External ear normal.  Left Ear: External ear normal.  Nose: Nose normal.  Mouth/Throat: Oropharynx is clear and moist. No oropharyngeal exudate.  Neck: Neck supple. Carotid bruit is not present. Thyromegaly present.       Right sided goiter without tenderness.    Cardiovascular: Normal rate, regular rhythm and normal heart sounds.  Exam reveals no gallop and no friction rub.   No murmur heard. Pulmonary/Chest: Effort normal and breath sounds normal. No respiratory distress. She has no wheezes. She has no rales.  Lymphadenopathy:    She has no cervical adenopathy.  Neurological: She is alert.  Skin: Skin is warm and dry. She is not diaphoretic.  Psychiatric: She has a normal mood and affect.          Assessment & Plan:

## 2011-04-04 NOTE — Assessment & Plan Note (Signed)
S/p ENT evaluation of neg bx. Obtain q6 month TFT's and will follow up with ENT for serial thyroid US.

## 2011-04-04 NOTE — Patient Instructions (Signed)
Please schedule tsh/free t4 (goiter), chem7, a1c(hyperglycemia) and cbc,ferritin(anemia) prior to next visit

## 2011-04-04 NOTE — Assessment & Plan Note (Signed)
Encouraged in wt loss efforts. Low sugar/carb diet. Obtain chem7, a1c

## 2011-04-04 NOTE — Assessment & Plan Note (Signed)
Obtain cbc and ferritin. 

## 2011-05-22 ENCOUNTER — Ambulatory Visit (INDEPENDENT_AMBULATORY_CARE_PROVIDER_SITE_OTHER): Payer: 59 | Admitting: Internal Medicine

## 2011-05-22 ENCOUNTER — Encounter: Payer: Self-pay | Admitting: Internal Medicine

## 2011-05-22 VITALS — BP 100/70 | HR 83 | Temp 98.3°F | Resp 18

## 2011-05-22 DIAGNOSIS — J329 Chronic sinusitis, unspecified: Secondary | ICD-10-CM | POA: Insufficient documentation

## 2011-05-22 MED ORDER — AMOXICILLIN-POT CLAVULANATE 875-125 MG PO TABS: 1.0000 | ORAL_TABLET | Freq: Two times a day (BID) | ORAL | Status: AC

## 2011-05-22 NOTE — Progress Notes (Signed)
  Subjective:    Patient ID: Natasha Hall, female    DOB: 1975/08/26, 35 y.o.   MRN: [id number]  HPI Pt presents to clinic for evaluation of sinus headache. Notes 1.5 wk h/o left frontal headache with associated nasal congestion and drainage. Now with 3d h/o bilateral maxillary pain/pressure and upper teeth pain. No f/c. otc medication not helping. No alleviating or exacerbating factors. No other complaints.  Past Medical History  Diagnosis Date  . Allergy     seasonal allergic rhinitis  . GERD (gastroesophageal reflux disease)   . Anemia     past history  . History of chicken pox   . Bursitis     bilateral hip R>L- sees ortho (uses tramadol & ibuprofen)  . Constipation    Past Surgical History  Procedure Date  . Breast reduction surgery 11/2005  . Foot surgery 05/2007    left foot    reports that she has never smoked. She has never used smokeless tobacco. She reports that she does not drink alcohol or use illicit drugs. family history includes Arthritis in an unspecified family member; Coronary artery disease in her father; Diabetes in her father; Hyperlipidemia in an unspecified family member; Hypertension in an unspecified family member; and Ovarian cancer in an unspecified family member. No Known Allergies   Review of Systems see hpi     Objective:   Physical Exam  Nursing note and vitals reviewed. Constitutional: She appears well-developed and well-nourished. No distress.  HENT:  Head: Normocephalic and atraumatic.  Right Ear: Tympanic membrane, external ear and ear canal normal.  Left Ear: Tympanic membrane, external ear and ear canal normal.  Nose: Right sinus exhibits maxillary sinus tenderness. Left sinus exhibits maxillary sinus tenderness.  Mouth/Throat: Oropharynx is clear and moist. No oropharyngeal exudate.  Eyes: Conjunctivae are normal. Right eye exhibits no discharge. Left eye exhibits no discharge. No scleral icterus.  Neck: Neck supple.  Cardiovascular:  Normal rate, regular rhythm and normal heart sounds.   Pulmonary/Chest: Effort normal and breath sounds normal. No respiratory distress. She has no wheezes. She has no rales.  Neurological: She is alert.  Skin: Skin is warm and dry. She is not diaphoretic.  Psychiatric: She has a normal mood and affect.          Assessment & Plan:

## 2011-05-22 NOTE — Assessment & Plan Note (Signed)
Begin course of augmentin. Followup if no improvement or worsening.  

## 2011-06-23 ENCOUNTER — Ambulatory Visit (INDEPENDENT_AMBULATORY_CARE_PROVIDER_SITE_OTHER): Payer: 59 | Admitting: Internal Medicine

## 2011-06-23 ENCOUNTER — Encounter: Payer: Self-pay | Admitting: Internal Medicine

## 2011-06-23 DIAGNOSIS — J069 Acute upper respiratory infection, unspecified: Secondary | ICD-10-CM

## 2011-06-23 DIAGNOSIS — H109 Unspecified conjunctivitis: Secondary | ICD-10-CM

## 2011-06-23 MED ORDER — AZITHROMYCIN 250 MG PO TABS: ORAL_TABLET | ORAL | Status: AC

## 2011-06-23 MED ORDER — POLYMYXIN B-TRIMETHOPRIM 10000-0.1 UNIT/ML-% OP SOLN: 1.0000 [drp] | OPHTHALMIC | Status: AC

## 2011-06-23 MED ORDER — HYDROCOD POLST-CHLORPHEN POLST 10-8 MG/5ML PO LQCR: 5.0000 mL | Freq: Two times a day (BID) | ORAL | Status: DC | PRN

## 2011-06-24 ENCOUNTER — Other Ambulatory Visit: Payer: 59

## 2011-06-25 ENCOUNTER — Ambulatory Visit
Admission: RE | Admit: 2011-06-25 | Discharge: 2011-06-25 | Disposition: A | Discharge status: Home / Self Care | Payer: 59 | Source: Ambulatory Visit | Attending: Otolaryngology | Admitting: Otolaryngology

## 2011-06-25 DIAGNOSIS — D497 Neoplasm of unspecified behavior of endocrine glands and other parts of nervous system: Secondary | ICD-10-CM

## 2011-06-28 DIAGNOSIS — H109 Unspecified conjunctivitis: Secondary | ICD-10-CM | POA: Insufficient documentation

## 2011-06-28 DIAGNOSIS — J069 Acute upper respiratory infection, unspecified: Secondary | ICD-10-CM | POA: Insufficient documentation

## 2011-06-28 NOTE — Assessment & Plan Note (Signed)
Begin abx gtts. Followup if no improvement or worsening.

## 2011-06-28 NOTE — Progress Notes (Signed)
  Subjective:    Patient ID: Natasha Hall, female    DOB: [birth year], 36 y.o.   MRN: [id number]  HPI Pt presents to clinic for evaluation of cough. Notes NP cough worse when lying down has myalgias, ha and chills. tmax 99.9. Taking theraflu without improvement. +post tussive emesis. Also notes left eye irriation, redness and yellow drainage. No fb or injury to eye. No change in visual acuity. No other complaints.  Past Medical History  Diagnosis Date  . Allergy     seasonal allergic rhinitis  . GERD (gastroesophageal reflux disease)   . Anemia     past history  . History of chicken pox   . Bursitis     bilateral hip R>L- sees ortho (uses tramadol & ibuprofen)  . Constipation    Past Surgical History  Procedure Date  . Breast reduction surgery 11/2005  . Foot surgery 05/2007    left foot    reports that she has never smoked. She has never used smokeless tobacco. She reports that she does not drink alcohol or use illicit drugs. family history includes Arthritis in an unspecified family member; Coronary artery disease in her father; Diabetes in her father; Hyperlipidemia in an unspecified family member; Hypertension in an unspecified family member; and Ovarian cancer in an unspecified family member. No Known Allergies   Review of Systems see hpi     Objective:   Physical Exam  Nursing note and vitals reviewed. Constitutional: She appears well-developed and well-nourished. No distress.  HENT:  Head: Normocephalic and atraumatic.  Right Ear: External ear normal.  Left Ear: External ear normal.  Nose: Nose normal.  Mouth/Throat: Oropharynx is clear and moist. No oropharyngeal exudate.  Eyes: EOM are normal. Pupils are equal, round, and reactive to light. Right eye exhibits no discharge. Left eye exhibits no discharge. Right conjunctiva is not injected. Right conjunctiva has no hemorrhage. Left conjunctiva is injected. Left conjunctiva has no hemorrhage. No scleral icterus.  Neck:  Neck supple.  Cardiovascular: Normal rate, regular rhythm and normal heart sounds.  Exam reveals no gallop and no friction rub.   No murmur heard. Pulmonary/Chest: Effort normal and breath sounds normal. No respiratory distress. She has no wheezes. She has no rales.  Neurological: She is alert.  Skin: Skin is warm and dry. No rash noted. She is not diaphoretic.  Psychiatric: She has a normal mood and affect.          Assessment & Plan:

## 2011-06-28 NOTE — Assessment & Plan Note (Signed)
Flu swab neg. tussionex prn-cautioned re possible sedating effect.abx provided to hold. Begin if sx's do not improve after total duration of 8-10 days. Followup if no improvement or worsening.

## 2011-07-08 ENCOUNTER — Telehealth: Payer: Self-pay | Admitting: Internal Medicine

## 2011-07-08 DIAGNOSIS — D649 Anemia, unspecified: Secondary | ICD-10-CM

## 2011-07-08 NOTE — Telephone Encounter (Signed)
Dr Artist Pais gave her a sample of integra + iron capsules.  Dr Rodena Medin wants her to get back on iron.  She would like an rx for these.  [facility] [place]

## 2011-07-09 MED ORDER — FERROUS SULFATE 325 (65 FE) MG PO TABS: 325.0000 mg | ORAL_TABLET | Freq: Every day | ORAL | Status: DC

## 2011-07-09 NOTE — Telephone Encounter (Signed)
Rx iron sent to pharmacy. 

## 2011-09-05 ENCOUNTER — Ambulatory Visit: Payer: 59 | Admitting: Internal Medicine

## 2011-09-12 ENCOUNTER — Encounter: Payer: Self-pay | Admitting: Internal Medicine

## 2011-09-12 ENCOUNTER — Ambulatory Visit (INDEPENDENT_AMBULATORY_CARE_PROVIDER_SITE_OTHER): Payer: 59 | Admitting: Internal Medicine

## 2011-09-12 VITALS — BP 110/60 | HR 101 | Temp 98.1°F | Resp 20 | Wt 254.0 lb

## 2011-09-12 DIAGNOSIS — K5900 Constipation, unspecified: Secondary | ICD-10-CM

## 2011-09-12 DIAGNOSIS — R51 Headache: Secondary | ICD-10-CM

## 2011-09-12 MED ORDER — TRAMADOL HCL 50 MG PO TABS: 50.0000 mg | ORAL_TABLET | Freq: Three times a day (TID) | ORAL | Status: AC | PRN

## 2011-09-12 MED ORDER — SUMATRIPTAN SUCCINATE 50 MG PO TABS: 50.0000 mg | ORAL_TABLET | ORAL | Status: DC | PRN

## 2011-09-12 NOTE — Assessment & Plan Note (Signed)
Attempt benefiber daily. Followup if no improvement or worsening.

## 2011-09-12 NOTE — Assessment & Plan Note (Signed)
Attempt imitrex prn. Also rf ultram prn. Has close f/u scheduled. Neurologically nonfocal. Keep ha diary.

## 2011-09-12 NOTE — Progress Notes (Signed)
  Subjective:    Patient ID: Natasha Hall, female    DOB: 1976-03-16, 36 y.o.   MRN: [id number]  HPI Pt presents to clinic for evaluation of headaches. H/o migraine ha's however recently pattern has worsened. Over the last several weeks began having 2-3 ha's per week and of increased severity. Typically are left hemicranial and left retro-orbital with occasional radiation to jaw. Has associated nausea but denies emesis, paresthesia, focal weakness, visual or speech disturbances. Ha's do not wake her from sleep. Takes aleve and cuts off the likes. Aleve helps dull the pain. Notes recent increased stress and less rest/sleep. No other alleviating or exacerbating factors. Also notes chronic intermittent constipation. No recent blood in stool. Attempted stool softener without improvement. No family h/o colon cancer or IBD.  Past Medical History  Diagnosis Date  . Allergy     seasonal allergic rhinitis  . GERD (gastroesophageal reflux disease)   . Anemia     past history  . History of chicken pox   . Bursitis     bilateral hip R>L- sees ortho (uses tramadol & ibuprofen)  . Constipation    Past Surgical History  Procedure Date  . Breast reduction surgery 11/2005  . Foot surgery 05/2007    left foot    reports that she has never smoked. She has never used smokeless tobacco. She reports that she does not drink alcohol or use illicit drugs. family history includes Arthritis in an unspecified family member; Coronary artery disease in her father; Diabetes in her father; Hyperlipidemia in an unspecified family member; Hypertension in an unspecified family member; and Ovarian cancer in an unspecified family member. No Known Allergies   Review of Systems see hpi     Objective:   Physical Exam  Nursing note and vitals reviewed. Constitutional: She appears well-developed and well-nourished.  HENT:  Head: Normocephalic and atraumatic.  Eyes: Conjunctivae and EOM are normal. Pupils are equal,  round, and reactive to light. No scleral icterus.  Neck: Neck supple.  Neurological: She is alert. No cranial nerve deficit. Coordination normal.  Skin: Skin is warm and dry.  Psychiatric: She has a normal mood and affect. Her behavior is normal.          Assessment & Plan:

## 2011-09-13 ENCOUNTER — Encounter: Payer: Self-pay | Admitting: Dietician

## 2011-09-13 ENCOUNTER — Encounter: Payer: 59 | Attending: Internal Medicine | Admitting: Dietician

## 2011-09-13 NOTE — Progress Notes (Signed)
Patient was seen on 09/13/2011 for the complete series of three diabetes self-management courses at the [facility]. The following learning objectives were met by the patient during this course:  Core 1:   Gain an understanding of diabetes and what causes it  Learn how diabetes is treated and the goals of treatment  Learn why, how and when to test BG  Learn how carbohydrate affects your glucose  Receive a personal food plan and learn how to count carbohydrates  Discover how physical activity enhances glucose control and overall health  Begin to gain confidence in your ability to manage diabetes  Handouts given during this class include:  Type 2 Diabetes: Basics Book  My Food Plan Book  Food and Activity Log  Core 2:   Describe causes, symptoms and treatment of hypoglycemia and hyperglycemia  Learn how to care for your glucose meter and strips  Explain how to manage diabetes during illness  List strategies to follow meal plan when dining out  Describe the effects of alcohol on glucose and how to use it safely  Describe problem solving skills for day-to-day glucose challenges  Describe ways to remain physically active  Describe the impact of regular activity on insulin resistance  Handouts given in this class:  Refrigerator magnet for Sick Day Guidelines  [facility] Oral Medication and Insulin handout  Core 3   Describe how diabetes changes over time  Understand why glucose may be out of target  Learn how diabetes changes over time  Learn about blood pressure, cholesterol, and heart health  Learn about lowering dietary fat and sodium  Understand the benefits of physical activity for heart health  Develop problem solving skills for times when glucose numbers are puzzling  Develop strategies for creating life balance  Learn how to identify if your treatment plan needs to change  Develop strategies for dealing with stress,  depression and staying motivated  Identify healthy weight-loss plans  Gain confidence that you can succeed in caring for your diabetes  Establish 2-3 goals that they will plan to diligently work on until they return                   for the free 73-month follow-up visit   The following handouts were given in class:  3 Month Follow Up Visit handout  Goal setting handout  Class evaluation form  Your patient has established the following 3 month goals for diabetes self-care:  Count Carbohydrates at most meals and snacks.  Increase my activity from 1-2 times a week to 3-4 times per week.  Follow-Up Plan: Patient was offered a 3 month follow-up visit for diabetes self-management education.

## 2011-10-20 ENCOUNTER — Encounter: Payer: 59 | Admitting: Internal Medicine

## 2011-11-18 ENCOUNTER — Encounter: Payer: 59 | Admitting: Internal Medicine

## 2011-11-20 ENCOUNTER — Encounter: Payer: 59 | Admitting: Internal Medicine

## 2011-12-23 ENCOUNTER — Ambulatory Visit: Payer: 59 | Admitting: Dietician

## 2011-12-26 ENCOUNTER — Encounter: Payer: Self-pay | Admitting: Internal Medicine

## 2011-12-26 ENCOUNTER — Ambulatory Visit (INDEPENDENT_AMBULATORY_CARE_PROVIDER_SITE_OTHER): Payer: 59 | Admitting: Internal Medicine

## 2011-12-26 ENCOUNTER — Other Ambulatory Visit (HOSPITAL_COMMUNITY)
Admission: RE | Admit: 2011-12-26 | Discharge: 2011-12-26 | Disposition: A | Discharge status: Home / Self Care | Payer: 59 | Source: Ambulatory Visit | Attending: Internal Medicine | Admitting: Internal Medicine

## 2011-12-26 VITALS — BP 98/68 | HR 83 | Temp 98.4°F | Resp 14 | Ht 66.5 in | Wt 253.2 lb

## 2011-12-26 DIAGNOSIS — Z01419 Encounter for gynecological examination (general) (routine) without abnormal findings: Secondary | ICD-10-CM

## 2011-12-26 DIAGNOSIS — Z0000 Encounter for general adult medical examination without abnormal findings: Secondary | ICD-10-CM

## 2011-12-26 LAB — CBC WITH DIFFERENTIAL/PLATELET
Eosinophils Relative: 4 % (ref 0–5)
HCT: 33.2 % — ABNORMAL LOW (ref 36.0–46.0)
Hemoglobin: 10.9 g/dL — ABNORMAL LOW (ref 12.0–15.0)
Lymphocytes Relative: 36 % (ref 12–46)
Lymphs Abs: 1.7 10*3/uL (ref 0.7–4.0)
MCH: 26.8 pg (ref 26.0–34.0)
MCV: 81.6 fL (ref 78.0–100.0)
Monocytes Absolute: 0.4 10*3/uL (ref 0.1–1.0)
Monocytes Relative: 8 % (ref 3–12)
Platelets: 302 10*3/uL (ref 150–400)
RBC: 4.07 MIL/uL (ref 3.87–5.11)
WBC: 4.6 10*3/uL (ref 4.0–10.5)

## 2011-12-26 LAB — BASIC METABOLIC PANEL
CO2: 25 mEq/L (ref 19–32)
Chloride: 105 mEq/L (ref 96–112)
Creat: 0.72 mg/dL (ref 0.50–1.10)
Glucose, Bld: 82 mg/dL (ref 70–99)

## 2011-12-26 LAB — TSH: TSH: 1.084 u[IU]/mL (ref 0.350–4.500)

## 2011-12-26 LAB — LIPID PANEL
HDL: 57 mg/dL (ref >39)
LDL Cholesterol: 83 mg/dL (ref 0–99)
Total CHOL/HDL Ratio: 2.7 Ratio

## 2011-12-26 LAB — HEPATIC FUNCTION PANEL
Albumin: 3.9 g/dL (ref 3.5–5.2)
Alkaline Phosphatase: 69 U/L (ref 39–117)
Bilirubin, Direct: 0.1 mg/dL (ref 0.0–0.3)
Total Bilirubin: 0.6 mg/dL (ref 0.3–1.2)

## 2011-12-26 NOTE — Progress Notes (Signed)
  Subjective:    Patient ID: Natasha Hall, female    DOB: 1975/12/13, 36 y.o.   MRN: [id number]  HPI Pt presents to clinic for annual exam. No active complaint.  Past Medical History  Diagnosis Date  . Allergy     seasonal allergic rhinitis  . GERD (gastroesophageal reflux disease)   . Anemia     past history  . History of chicken pox   . Bursitis     bilateral hip R>L- sees ortho (uses tramadol & ibuprofen)  . Constipation    Past Surgical History  Procedure Date  . Breast reduction surgery 11/2005  . Foot surgery 05/2007    left foot    reports that she has never smoked. She has never used smokeless tobacco. She reports that she does not drink alcohol or use illicit drugs. family history includes Arthritis in an unspecified family member; Coronary artery disease in her father; Diabetes in her father; Hyperlipidemia in an unspecified family member; Hypertension in an unspecified family member; and Ovarian cancer in an unspecified family member. No Known Allergies   Review of Systems see hpi     Objective:   Physical Exam  Nursing note and vitals reviewed. Constitutional: She appears well-developed and well-nourished. No distress.  HENT:  Head: Normocephalic and atraumatic.  Right Ear: External ear normal.  Left Ear: External ear normal.  Nose: Nose normal.  Mouth/Throat: Oropharynx is clear and moist. No oropharyngeal exudate.  Eyes: Conjunctivae and EOM are normal. Pupils are equal, round, and reactive to light. No scleral icterus.  Neck: Neck supple. Thyromegaly present.       Right goiter-stable  Cardiovascular: Normal rate, regular rhythm and normal heart sounds.  Exam reveals no gallop and no friction rub.   No murmur heard. Pulmonary/Chest: Effort normal and breath sounds normal. No respiratory distress. She has no wheezes. She has no rales.  Genitourinary:       With female nurse escort exam is performed. Ext genitalia wnl. Speculum exam performed and cervix  visualized as nl. Vaginal mucosa nl. Pap smear obtained. No CMT or obvious adnexal mass.  Breast exam peformed. No axillary adenopathy or obvious breast mass/nodule  Lymphadenopathy:    She has no cervical adenopathy.  Neurological: She is alert.  Skin: Skin is warm and dry. She is not diaphoretic.  Psychiatric: She has a normal mood and affect.          Assessment & Plan:

## 2011-12-26 NOTE — Assessment & Plan Note (Addendum)
Nl exam. Pap smear pending. Encouraged monthly self breast exam. Obtain cpe labs.

## 2011-12-27 LAB — URINALYSIS, MICROSCOPIC ONLY
Casts: NONE SEEN
Squamous Epithelial / LPF: NONE SEEN

## 2011-12-27 LAB — URINALYSIS, ROUTINE W REFLEX MICROSCOPIC
Leukocytes, UA: NEGATIVE
Protein, ur: NEGATIVE mg/dL
Urobilinogen, UA: 0.2 mg/dL (ref 0.0–1.0)

## 2011-12-27 LAB — HEMOGLOBIN A1C: Hgb A1c MFr Bld: 6.1 % — ABNORMAL HIGH (ref <5.7)

## 2011-12-29 ENCOUNTER — Encounter: Payer: 59 | Attending: Internal Medicine | Admitting: Dietician

## 2011-12-29 VITALS — Wt 255.1 lb

## 2011-12-29 DIAGNOSIS — E119 Type 2 diabetes mellitus without complications: Secondary | ICD-10-CM | POA: Insufficient documentation

## 2011-12-29 DIAGNOSIS — Z713 Dietary counseling and surveillance: Secondary | ICD-10-CM | POA: Insufficient documentation

## 2011-12-29 DIAGNOSIS — R7309 Other abnormal glucose: Secondary | ICD-10-CM

## 2012-01-04 NOTE — Progress Notes (Signed)
  Patient was seen on 12/29/2011 for their 3 month follow-up as a part of the diabetes self-management courses at the [facility]. The following learning objectives were met by your patient during this course:  Patient self reports the following:  Diabetes control has improved since diabetes self-management training: No Number of days blood glucose is >200: Unknown, currently not checking blood glucose levels Last MD appointment for diabetes: Due 12/26/2011 Changes in treatment plan: None at this time. Confidence with ability to manage diabetes: Yes and no.  I know that I really need to lose weight.  Has gained approximately 10 lb since January, 2013 Areas for improvement with diabetes self-care: Weight loss Willingness to participate in diabetes support group: Took down the time for the meeting will decide.  Please see Diabetes Flow sheet for findings related to patient's self-care.  Follow-Up Plan: 1.  Get back to label reading and counting carbohydrates. 2.  Get back to regular exercise. 3.  Lose some weight.  Patient is eligible for a "free" 30 minute diabetes self-care appointment in the next year. Patient to call and schedule as needed.

## 2012-01-05 ENCOUNTER — Telehealth: Payer: Self-pay | Admitting: *Deleted

## 2012-01-05 NOTE — Telephone Encounter (Signed)
Called back & finished message on pt's VM regarding referral to GYN; believe that the 6-mth Pap F/U is to be done via GYN referral, please check Dr. Ilda Foil note and place referral order. Thanks/SLS

## 2012-01-05 NOTE — Telephone Encounter (Signed)
Pt called back stating she was disconnected. Notified pt of results below and she voices understanding. Pt was transferred to front office to confirm 6 month f/u with pap smear appt.  Pt also requests to restart her birth control (Triphasic) that she stopped on her own. Reports that she takes this for regulation of her menstrual cycles. Pt would like rx sent to [facility].  Please advise.

## 2012-01-05 NOTE — Telephone Encounter (Signed)
Message copied by Regis Bill on Mon Jan 05, 2012  4:31 PM ------      Message from: Edwyna Perfect      Created: Sat Jan 03, 2012  8:51 AM       1) remains mildly anemic but stable. Take feso4 325mg  at least bid to tid if not already.      2) blood sugar avg mildly above nl and stable. Remember to limit sugars/carbs and get regular cardio exercise at least 3x/week      3) pap smear was mildly abnormal. Not a cancerous finding but needs to be rechecked in 34months (pls make 34month f/u for repeat pap). Often can be related to a hpv (viral) infection and can clear up on its on. However if is still mildly abnormal at 6 months then would recommend gyn referral.      4) other labs nl

## 2012-01-12 NOTE — Telephone Encounter (Signed)
Ok rf6 

## 2012-01-12 NOTE — Telephone Encounter (Signed)
Please advise re: restarting birth control (see note below).

## 2012-01-13 ENCOUNTER — Telehealth: Payer: Self-pay | Admitting: *Deleted

## 2012-01-13 MED ORDER — NORGESTIMATE-ETH ESTRADIOL 0.25-35 MG-MCG PO TABS: 1.0000 | ORAL_TABLET | Freq: Every day | ORAL | Status: DC

## 2012-01-13 MED ORDER — NORGESTIM-ETH ESTRAD TRIPHASIC 0.18/0.215/0.25 MG-25 MCG PO TABS: 1.0000 | ORAL_TABLET | Freq: Every day | ORAL | Status: DC

## 2012-01-13 NOTE — Telephone Encounter (Signed)
Received call from pharmacy that triphasic will be $100 copay for pt. They are asking for an alternative with a generic (ortho cyclen) as that would be a free copay for the pt. Please advise.

## 2012-01-13 NOTE — Telephone Encounter (Signed)
Refills sent, pt notified. 

## 2012-01-13 NOTE — Telephone Encounter (Signed)
Rx sent 

## 2012-01-13 NOTE — Telephone Encounter (Signed)
Ok to substitute?

## 2012-02-25 ENCOUNTER — Ambulatory Visit (INDEPENDENT_AMBULATORY_CARE_PROVIDER_SITE_OTHER): Payer: 59 | Admitting: Gastroenterology

## 2012-02-25 ENCOUNTER — Encounter: Payer: Self-pay | Admitting: Gastroenterology

## 2012-02-25 VITALS — BP 120/72 | HR 68 | Ht 66.5 in | Wt 258.0 lb

## 2012-02-25 DIAGNOSIS — K5900 Constipation, unspecified: Secondary | ICD-10-CM

## 2012-02-25 MED ORDER — NA SULFATE-K SULFATE-MG SULF 17.5-3.13-1.6 GM/177ML PO SOLN: 1.0000 | Freq: Once | ORAL | Status: DC

## 2012-02-25 NOTE — Progress Notes (Signed)
History of Present Illness: Pleasant 36 year old [place] American female referred at the request of Dr. Rodena Medin for evaluation of constipation. This is been a chronic but progressive problem. She may go several days without a complete or incomplete bowel movement. She complains of abdominal pain with frequent spontaneous cramps. This occurs whether or not she moves her bowels. She feels distended most of the time. Bowel movements are often preceded by lower right crampy pain as well. She has seen small amounts of blood and tissue in the water which she attributes to hemorrhoids.    Past Medical History  Diagnosis Date  . Allergy     seasonal allergic rhinitis  . GERD (gastroesophageal reflux disease)   . Anemia     past history  . History of chicken pox   . Bursitis     bilateral hip R>L- sees ortho (uses tramadol & ibuprofen)  . Constipation   . Chronic headaches   . Thyroid disease   . Pre-diabetes    Past Surgical History  Procedure Date  . Breast reduction surgery 11/2005  . Foot surgery 05/2007    left foot  . Keloid excision    family history includes Colon polyps in her father and maternal grandfather; Coronary artery disease in her father; Diabetes in her father; and Ovarian cancer in her maternal grandmother.  There is no history of Colon cancer. Current Outpatient Prescriptions  Medication Sig Dispense Refill  . BIOTIN PO Take 2 each by mouth daily.      . cetirizine (ZYRTEC) 10 MG tablet Take 10 mg by mouth daily as needed.      . docusate sodium (COLACE) 100 MG capsule Take 100 mg by mouth daily as needed.        . ferrous sulfate 325 (65 FE) MG tablet Take 1 tablet (325 mg total) by mouth daily.  90 tablet  3  . ibuprofen (ADVIL,MOTRIN) 200 MG tablet Take 200 mg by mouth as needed.        . loratadine (CLARITIN) 10 MG tablet Take 10 mg by mouth daily as needed.        . Multiple Vitamins-Calcium (ONE-A-DAY WOMENS FORMULA) TABS Take 1 tablet by mouth daily.      .  norgestimate-ethinyl estradiol (ORTHO-CYCLEN,SPRINTEC,PREVIFEM) 0.25-35 MG-MCG tablet Take 1 tablet by mouth daily.  1 Package  6  . SUMAtriptan (IMITREX) 50 MG tablet Take 1 tablet (50 mg total) by mouth every 2 (two) hours as needed for migraine. Maximum 200mg  in 24 hours  10 tablet  3   Allergies as of 02/25/2012  . (No Known Allergies)    reports that she has never smoked. She has never used smokeless tobacco. She reports that she does not drink alcohol or use illicit drugs.     Review of Systems: Pertinent positive and negative review of systems were noted in the above HPI section. All other review of systems were otherwise negative.  Vital signs were reviewed in today's medical record Physical Exam: General: Well developed , well nourished, no acute distress Head: Normocephalic and atraumatic Eyes:  sclerae anicteric, EOMI Ears: Normal auditory acuity Mouth: No deformity or lesions Neck: Supple, no masses or thyromegaly Lungs: Clear throughout to auscultation Heart: Regular rate and rhythm; no murmurs, rubs or bruits Abdomen: Soft, non tender and non distended. No masses, hepatosplenomegaly or hernias noted. Normal Bowel sounds Rectal:deferred Musculoskeletal: Symmetrical with no gross deformities  Skin: No lesions on visible extremities Pulses:  Normal pulses noted Extremities: No clubbing, cyanosis,  edema or deformities noted Neurological: Alert oriented x 4, grossly nonfocal Cervical Nodes:  No significant cervical adenopathy Inguinal Nodes: No significant inguinal adenopathy Psychological:  Alert and cooperative. Normal mood and affect

## 2012-02-25 NOTE — Patient Instructions (Addendum)
Colonoscopy A colonoscopy is an exam to evaluate your entire colon. In this exam, your colon is cleansed. A long fiberoptic tube is inserted through your rectum and into your colon. The fiberoptic scope (endoscope) is a long bundle of enclosed and very flexible fibers. These fibers transmit light to the area examined and send images from that area to your caregiver. Discomfort is usually minimal. You may be given a drug to help you sleep (sedative) during or prior to the procedure. This exam helps to detect lumps (tumors), polyps, inflammation, and areas of bleeding. Your caregiver may also take a small piece of tissue (biopsy) that will be examined under a microscope. LET YOUR CAREGIVER KNOW ABOUT:   Allergies to food or medicine.   Medicines taken, including vitamins, herbs, eyedrops, over-the-counter medicines, and creams.   Use of steroids (by mouth or creams).   Previous problems with anesthetics or numbing medicines.   History of bleeding problems or blood clots.   Previous surgery.   Other health problems, including diabetes and kidney problems.   Possibility of pregnancy, if this applies.  BEFORE THE PROCEDURE   A clear liquid diet may be required for 2 days before the exam.   Ask your caregiver about changing or stopping your regular medications.   Liquid injections (enemas) or laxatives may be required.   A large amount of electrolyte solution may be given to you to drink over a short period of time. This solution is used to clean out your colon.   You should be present 60 minutes prior to your procedure or as directed by your caregiver.  AFTER THE PROCEDURE   If you received a sedative or pain relieving medication, you will need to arrange for someone to drive you home.   Occasionally, there is a little blood passed with the first bowel movement. Do not be concerned.  FINDING OUT THE RESULTS OF YOUR TEST Not all test results are available during your visit. If your test  results are not back during the visit, make an appointment with your caregiver to find out the results. Do not assume everything is normal if you have not heard from your caregiver or the medical facility. It is important for you to follow up on all of your test results. HOME CARE INSTRUCTIONS   It is not unusual to pass moderate amounts of gas and experience mild abdominal cramping following the procedure. This is due to air being used to inflate your colon during the exam. Walking or a warm pack on your belly (abdomen) may help.   You may resume all normal meals and activities after sedatives and medicines have worn off.   Only take over-the-counter or prescription medicines for pain, discomfort, or fever as directed by your caregiver. Do not use aspirin or blood thinners if a biopsy was taken. Consult your caregiver for medicine usage if biopsies were taken.  SEEK IMMEDIATE MEDICAL CARE IF:   You have a fever.   You pass large blood clots or fill a toilet with blood following the procedure. This may also occur 10 to 14 days following the procedure. This is more likely if a biopsy was taken.   You develop abdominal pain that keeps getting worse and cannot be relieved with medicine.  Document Released: 05/23/2000 Document Revised: 05/15/2011 Document Reviewed: 01/06/2008 Exit[employer] Patient Information 2012 [employer]. 

## 2012-02-25 NOTE — Assessment & Plan Note (Addendum)
Constipation is probably  functional and likely related to IBS as well  inasmuch as she has frequent and fairly chronic abdominal discomfort. Limited rectal bleeding is most likely related to hemorrhoids. The patient is concerned about bleeding because of a family history of polyps in a sibling and father.  Recommendations #1 colonoscopy #2 patient will consider enrollment in an IBS trial pending results of above

## 2012-03-05 ENCOUNTER — Ambulatory Visit (AMBULATORY_SURGERY_CENTER): Payer: 59 | Admitting: Gastroenterology

## 2012-03-05 ENCOUNTER — Encounter: Payer: Self-pay | Admitting: Gastroenterology

## 2012-03-05 VITALS — BP 130/88 | HR 84 | Temp 97.2°F | Resp 20 | Ht 66.5 in | Wt 258.0 lb

## 2012-03-05 DIAGNOSIS — K5900 Constipation, unspecified: Secondary | ICD-10-CM

## 2012-03-05 MED ORDER — SODIUM CHLORIDE 0.9 % IV SOLN: 500.0000 mL | INTRAVENOUS | Status: DC

## 2012-03-05 NOTE — Op Note (Signed)
[facility] [address] [place], [address]   COLONOSCOPY PROCEDURE REPORT  PATIENT: Natasha Hall, Natasha Hall  MR#: [id number] BIRTHDATE: [birth year] , 36  yrs. old GENDER: Female ENDOSCOPIST: Louis Meckel, MD REFERRED [id number] Rodena Medin, M.D. PROCEDURE DATE:  03/05/2012 PROCEDURE:   Colonoscopy, diagnostic ASA CLASS:   Class II INDICATIONS:constipation. MEDICATIONS: MAC sedation, administered by CRNA and propofol (Diprivan) 300mg  IV  DESCRIPTION OF PROCEDURE:   After the risks benefits and alternatives of the procedure were thoroughly explained, informed consent was obtained.  A digital rectal exam revealed no abnormalities of the rectum.   The LB [id number]  endoscope was introduced through the anus and advanced to the cecum, which was identified by both the appendix and ileocecal valve. No adverse events experienced.   The quality of the prep was Suprep excellent The instrument was then slowly withdrawn as the colon was fully examined.      COLON FINDINGS: Mild diverticulosis was noted in the sigmoid colon. The colon mucosa was otherwise normal.  Retroflexed views revealed no abnormalities. The time to cecum=4 minutes 48 seconds. Withdrawal time=6 minutes 19 seconds.  The scope was withdrawn and the procedure completed. COMPLICATIONS: There were no complications.  ENDOSCOPIC IMPRESSION: 1.   Mild diverticulosis was noted in the sigmoid colon 2.   The colon mucosa was otherwise normal  RECOMMENDATIONS: 1.  Patient will consider enrollment in an IBS /constipation trial. Otherwise she will start Linzess ug qd    eSigned:  Louis Meckel, MD 03/05/2012 11:05 AM   cc:

## 2012-03-05 NOTE — Patient Instructions (Signed)
Mild diverticulosis seen only. Handout given. Follow instructions given by Dr.Kaplan. Resume current medications. Call us with any questions or concerns. Thank you!!   YOU HAD AN ENDOSCOPIC PROCEDURE TODAY AT THE [facility]: Refer to the procedure report that was given to you for any specific questions about what was found during the examination.  If the procedure report does not answer your questions, please call your gastroenterologist to clarify.  If you requested that your care partner not be given the details of your procedure findings, then the procedure report has been included in a sealed envelope for you to review at your convenience later.  YOU SHOULD EXPECT: Some feelings of bloating in the abdomen. Passage of more gas than usual.  Walking can help get rid of the air that was put into your GI tract during the procedure and reduce the bloating. If you had a lower endoscopy (such as a colonoscopy or flexible sigmoidoscopy) you may notice spotting of blood in your stool or on the toilet paper. If you underwent a bowel prep for your procedure, then you may not have a normal bowel movement for a few days.  DIET: Your first meal following the procedure should be a light meal and then it is ok to progress to your normal diet.  A half-sandwich or bowl of soup is an example of a good first meal.  Heavy or fried foods are harder to digest and may make you feel nauseous or bloated.  Likewise meals heavy in dairy and vegetables can cause extra gas to form and this can also increase the bloating.  Drink plenty of fluids but you should avoid alcoholic beverages for 24 hours.  ACTIVITY: Your care partner should take you home directly after the procedure.  You should plan to take it easy, moving slowly for the rest of the day.  You can resume normal activity the day after the procedure however you should NOT DRIVE or use heavy machinery for 24 hours (because of the sedation medicines used during  the test).    SYMPTOMS TO REPORT IMMEDIATELY: A gastroenterologist can be reached at any hour.  During normal business hours, 8:30 AM to 5:00 PM Monday through Friday, call [phone].  After hours and on weekends, please call the GI answering service at [phone] who will take a message and have the physician on call contact you.   Following lower endoscopy (colonoscopy or flexible sigmoidoscopy):  Excessive amounts of blood in the stool  Significant tenderness or worsening of abdominal pains  Swelling of the abdomen that is new, acute  Fever of 100F or higher  FOLLOW UP: If any biopsies were taken you will be contacted by phone or by letter within the next 1-3 weeks.  Call your gastroenterologist if you have not heard about the biopsies in 3 weeks.  Our staff will call the home number listed on your records the next business day following your procedure to check on you and address any questions or concerns that you may have at that time regarding the information given to you following your procedure. This is a courtesy call and so if there is no answer at the home number and we have not heard from you through the emergency physician on call, we will assume that you have returned to your regular daily activities without incident.  SIGNATURES/CONFIDENTIALITY: You and/or your care partner have signed paperwork which will be entered into your electronic medical record.  These signatures attest to the  fact that that the information above on your After Visit Summary has been reviewed and is understood.  Full responsibility of the confidentiality of this discharge information lies with you and/or your care-partner.

## 2012-03-05 NOTE — Progress Notes (Signed)
Patient did not experience any of the following events: a burn prior to discharge; a fall within the facility; wrong site/side/patient/procedure/implant event; or a hospital transfer or hospital admission upon discharge from the facility. (G8907) Patient did not have preoperative order for IV antibiotic SSI prophylaxis. (G8918)  

## 2012-03-08 ENCOUNTER — Telehealth: Payer: Self-pay | Admitting: *Deleted

## 2012-03-08 NOTE — Telephone Encounter (Signed)
Left message on number given in admitting prior to procedure on Friday. ewm

## 2012-03-23 ENCOUNTER — Other Ambulatory Visit: Payer: Self-pay | Admitting: Gastroenterology

## 2012-03-23 NOTE — Telephone Encounter (Signed)
Begin Linzess 290ug qd OV 3 weeks

## 2012-03-26 MED ORDER — LINACLOTIDE 145 MCG PO CAPS: 145.0000 ug | ORAL_CAPSULE | Freq: Every day | ORAL | Status: DC

## 2012-03-26 NOTE — Telephone Encounter (Signed)
Called pt to inform med was sent to her pahrmacy

## 2012-04-20 ENCOUNTER — Emergency Department
Admission: EM | Admit: 2012-04-20 | Discharge: 2012-04-20 | Disposition: A | Discharge status: Home / Self Care | Payer: 59 | Source: Home / Self Care

## 2012-04-20 ENCOUNTER — Encounter: Payer: Self-pay | Admitting: *Deleted

## 2012-04-20 DIAGNOSIS — M25559 Pain in unspecified hip: Secondary | ICD-10-CM

## 2012-04-20 DIAGNOSIS — M7062 Trochanteric bursitis, left hip: Secondary | ICD-10-CM

## 2012-04-20 DIAGNOSIS — M25552 Pain in left hip: Secondary | ICD-10-CM

## 2012-04-20 MED ORDER — TRAMADOL HCL 50 MG PO TABS: 50.0000 mg | ORAL_TABLET | Freq: Four times a day (QID) | ORAL | Status: DC | PRN

## 2012-04-20 NOTE — ED Provider Notes (Signed)
History     CSN: [id number]  Arrival date & time 04/20/12  1831   First MD Initiated Contact with Patient 04/20/12 1847      Chief Complaint  Patient presents with  . Hip Pain    left   HPI Left hip pain x1 day. Patient with baseline history of left-sided trochanteric bursitis has been followed by [facility]opedics in the past. Patient states her last flare was approximately July 2011. Patient states she woke up this morning with severe left-sided hip pain that seemed to persist with ambulation throughout the day. Patient states that she try to make an appointment with her orthopedist was unable to be seen for another 2-3 weeks. Patient states the pain is predominantly on the lateral hip with radiation down to the mid thigh as well some radiation around to the posterior buttocks. This is similar distribution it as in the past. Patient states that she received a steroid injection for this in the past and that resolved symptoms. Past Medical History  Diagnosis Date  . Allergy     seasonal allergic rhinitis  . GERD (gastroesophageal reflux disease)   . Anemia     past history  . History of chicken pox   . Bursitis     bilateral hip R>L- sees ortho (uses tramadol & ibuprofen)  . Constipation   . Chronic headaches   . Thyroid disease   . Pre-diabetes     Past Surgical History  Procedure Date  . Breast reduction surgery 11/2005  . Foot surgery 05/2007    left foot  . Keloid excision     Family History  Problem Relation Age of Onset  . Diabetes Father   . Coronary artery disease Father   . Colon polyps Father   . Heart failure Father   . Hypertension Father   . Hyperlipidemia Father   . Ovarian cancer Maternal Grandmother   . Colon polyps Maternal Grandfather   . Colon cancer Neg Hx     History  Substance Use Topics  . Smoking status: Never Smoker   . Smokeless tobacco: Never Used  . Alcohol Use: No    OB History    Grav Para Term Preterm Abortions TAB SAB  Ect Mult Living                  Review of Systems  All other systems reviewed and are negative.    Allergies  Review of patient's allergies indicates no known allergies.  Home Medications   Current Outpatient Rx  Name  Route  Sig  Dispense  Refill  . TRAMADOL HCL 50 MG PO TABS   Oral   Take 50 mg by mouth every 6 (six) hours as needed.         Marland Kitchen BIOTIN PO   Oral   Take 2 each by mouth daily.         Marland Kitchen CETIRIZINE HCL 10 MG PO TABS   Oral   Take 10 mg by mouth daily as needed.         Marland Kitchen DOCUSATE SODIUM 100 MG PO CAPS   Oral   Take 100 mg by mouth daily as needed.           Marland Kitchen FERROUS SULFATE 325 (65 FE) MG PO TABS   Oral   Take 1 tablet (325 mg total) by mouth daily.   90 tablet   3   . IBUPROFEN 200 MG PO TABS   Oral  Take 200 mg by mouth as needed.           Marland Kitchen LINACLOTIDE 145 MCG PO CAPS   Oral   Take 1 capsule (145 mcg total) by mouth daily.   30 capsule   3   . LORATADINE 10 MG PO TABS   Oral   Take 10 mg by mouth daily as needed.           Marygrace Drought WOMENS FORMULA PO TABS   Oral   Take 1 tablet by mouth daily.         Marland Kitchen NORGESTIMATE-ETH ESTRADIOL 0.25-35 MG-MCG PO TABS   Oral   Take 1 tablet by mouth daily.   1 Package   6   . SUMATRIPTAN SUCCINATE 50 MG PO TABS   Oral   Take 1 tablet (50 mg total) by mouth every 2 (two) hours as needed for migraine. Maximum 200mg  in 24 hours   10 tablet   3     BP 119/78  Pulse 89  Temp 98.3 F (36.8 C) (Oral)  Resp 16  SpO2 98%  LMP 04/02/2012  Physical Exam  Constitutional: She appears well-developed and well-nourished.  HENT:  Head: Normocephalic and atraumatic.  Eyes: Conjunctivae normal are normal. Pupils are equal, round, and reactive to light.  Neck: Normal range of motion. Neck supple.  Cardiovascular: Normal rate and regular rhythm.   Pulmonary/Chest: Effort normal.  Abdominal: Soft.  Musculoskeletal:       + TTP over L trochanteric bursa.  Mild pain with hip  flexion.  Marked pain with resisted hip abduction.   Neurological: She is alert.       Neurovascularly intact distally    Skin: Skin is warm.    ED Course  Procedures (including critical care time) Trochanteric Bursitis: L hip area palpated to most tender area of trochanteric bursa. Area marked and prepped in sterile fashion. Risks and benefits were discussed with pt. Pt agreed to procedure. Local ethyl chloride spray applied to most affected area. 1 cc of triamcinolone 40mg /mL and 4 ccs of 2% lidocaine without epinephrine injected into area in peppered fashion. Needle removed. Topical bandage applied.  Labs Reviewed - No data to display No results found.   1. Hip pain, left   2. Trochanteric bursitis of left hip       MDM  Therapeutic injection to R hip.  Tramadol for prn pain.  Discussed general care and red flags for reevaluation.  Follow up as needed.     The patient and/or caregiver has been counseled thoroughly with regard to treatment plan and/or medications prescribed including dosage, schedule, interactions, rationale for use, and possible side effects and they verbalize understanding. Diagnoses and expected course of recovery discussed and will return if not improved as expected or if the condition worsens. Patient and/or caregiver verbalized understanding.             Doree Albee, MD 04/20/12 [phone]

## 2012-04-20 NOTE — ED Notes (Signed)
Pt c/o left hip pain x today. She has a hx of bursitis. No OTC meds.

## 2012-04-22 ENCOUNTER — Ambulatory Visit: Payer: 59 | Admitting: Internal Medicine

## 2012-04-23 ENCOUNTER — Telehealth: Payer: Self-pay | Admitting: Emergency Medicine

## 2012-05-28 ENCOUNTER — Other Ambulatory Visit: Payer: Self-pay | Admitting: Otolaryngology

## 2012-05-28 DIAGNOSIS — E049 Nontoxic goiter, unspecified: Secondary | ICD-10-CM

## 2012-05-31 ENCOUNTER — Other Ambulatory Visit: Payer: 59

## 2012-06-23 ENCOUNTER — Ambulatory Visit: Payer: 59 | Admitting: Internal Medicine

## 2012-07-24 ENCOUNTER — Other Ambulatory Visit: Payer: Self-pay

## 2012-07-26 ENCOUNTER — Emergency Department (HOSPITAL_BASED_OUTPATIENT_CLINIC_OR_DEPARTMENT_OTHER): Payer: 59

## 2012-07-26 ENCOUNTER — Emergency Department (HOSPITAL_BASED_OUTPATIENT_CLINIC_OR_DEPARTMENT_OTHER)
Admission: EM | Admit: 2012-07-26 | Discharge: 2012-07-26 | Disposition: A | Discharge status: Home / Self Care | Payer: 59 | Attending: Emergency Medicine | Admitting: Emergency Medicine

## 2012-07-26 ENCOUNTER — Encounter (HOSPITAL_BASED_OUTPATIENT_CLINIC_OR_DEPARTMENT_OTHER): Payer: Self-pay | Admitting: *Deleted

## 2012-07-26 DIAGNOSIS — M7150 Other bursitis, not elsewhere classified, unspecified site: Secondary | ICD-10-CM | POA: Insufficient documentation

## 2012-07-26 DIAGNOSIS — Z8639 Personal history of other endocrine, nutritional and metabolic disease: Secondary | ICD-10-CM | POA: Insufficient documentation

## 2012-07-26 DIAGNOSIS — R109 Unspecified abdominal pain: Secondary | ICD-10-CM

## 2012-07-26 DIAGNOSIS — Z3202 Encounter for pregnancy test, result negative: Secondary | ICD-10-CM | POA: Insufficient documentation

## 2012-07-26 DIAGNOSIS — Z8619 Personal history of other infectious and parasitic diseases: Secondary | ICD-10-CM | POA: Insufficient documentation

## 2012-07-26 DIAGNOSIS — Z79899 Other long term (current) drug therapy: Secondary | ICD-10-CM | POA: Insufficient documentation

## 2012-07-26 DIAGNOSIS — R51 Headache: Secondary | ICD-10-CM | POA: Insufficient documentation

## 2012-07-26 DIAGNOSIS — D649 Anemia, unspecified: Secondary | ICD-10-CM | POA: Insufficient documentation

## 2012-07-26 DIAGNOSIS — Z862 Personal history of diseases of the blood and blood-forming organs and certain disorders involving the immune mechanism: Secondary | ICD-10-CM | POA: Insufficient documentation

## 2012-07-26 DIAGNOSIS — R7309 Other abnormal glucose: Secondary | ICD-10-CM | POA: Insufficient documentation

## 2012-07-26 DIAGNOSIS — Z791 Long term (current) use of non-steroidal anti-inflammatories (NSAID): Secondary | ICD-10-CM | POA: Insufficient documentation

## 2012-07-26 DIAGNOSIS — R1031 Right lower quadrant pain: Secondary | ICD-10-CM | POA: Insufficient documentation

## 2012-07-26 DIAGNOSIS — R630 Anorexia: Secondary | ICD-10-CM | POA: Insufficient documentation

## 2012-07-26 DIAGNOSIS — Z8719 Personal history of other diseases of the digestive system: Secondary | ICD-10-CM | POA: Insufficient documentation

## 2012-07-26 LAB — COMPREHENSIVE METABOLIC PANEL
AST: 17 U/L (ref 0–37)
BUN: 8 mg/dL (ref 6–23)
CO2: 25 mEq/L (ref 19–32)
Calcium: 9.3 mg/dL (ref 8.4–10.5)
Creatinine, Ser: 0.7 mg/dL (ref 0.50–1.10)
GFR calc non Af Amer: >90 mL/min (ref >90)
Total Bilirubin: 0.3 mg/dL (ref 0.3–1.2)

## 2012-07-26 LAB — CBC WITH DIFFERENTIAL/PLATELET
Basophils Absolute: 0 10*3/uL (ref 0.0–0.1)
Basophils Relative: 0 % (ref 0–1)
Eosinophils Relative: 3 % (ref 0–5)
HCT: 34.4 % — ABNORMAL LOW (ref 36.0–46.0)
Hemoglobin: 11 g/dL — ABNORMAL LOW (ref 12.0–15.0)
MCHC: 32 g/dL (ref 30.0–36.0)
MCV: 85.1 fL (ref 78.0–100.0)
Monocytes Absolute: 0.4 10*3/uL (ref 0.1–1.0)
Monocytes Relative: 7 % (ref 3–12)
RDW: 15.2 % (ref 11.5–15.5)

## 2012-07-26 LAB — LIPASE, BLOOD: Lipase: 16 U/L (ref 11–59)

## 2012-07-26 LAB — PREGNANCY, URINE: Preg Test, Ur: NEGATIVE

## 2012-07-26 LAB — URINALYSIS, ROUTINE W REFLEX MICROSCOPIC
Glucose, UA: NEGATIVE mg/dL
Leukocytes, UA: NEGATIVE
Protein, ur: NEGATIVE mg/dL
Specific Gravity, Urine: 1.036 — ABNORMAL HIGH (ref 1.005–1.030)
Urobilinogen, UA: 0.2 mg/dL (ref 0.0–1.0)

## 2012-07-26 MED ORDER — SODIUM CHLORIDE 0.9 % IV SOLN: 1000.0000 mL | Freq: Once | INTRAVENOUS | Status: DC

## 2012-07-26 MED ORDER — SENNOSIDES-DOCUSATE SODIUM 8.6-50 MG PO TABS: 2.0000 | ORAL_TABLET | Freq: Every day | ORAL | Status: DC

## 2012-07-26 MED ORDER — IOHEXOL 300 MG/ML  SOLN
50.0000 mL | Freq: Once | INTRAMUSCULAR | Status: AC | PRN
  Administered 2012-07-26: 50 mL via ORAL

## 2012-07-26 MED ORDER — IOHEXOL 300 MG/ML  SOLN
100.0000 mL | Freq: Once | INTRAMUSCULAR | Status: AC | PRN
  Administered 2012-07-26: 100 mL via INTRAVENOUS

## 2012-07-26 NOTE — ED Provider Notes (Signed)
History  This chart was scribed for Natasha Munch, MD by [name], ED Scribe. This patient was seen in room MH05/MH05 and the patient's care was started at 5:12 PM.  CSN: [id number]  Arrival date & time 07/26/12  1704   First MD Initiated Contact with Patient 07/26/12 1712      Chief Complaint  Patient presents with  . Abdominal Pain    The history is provided by the patient. No language interpreter was used.    Natasha Hall is a 37 y.o. female who presents to the Emergency Department complaining of gradual onset, gradually worsening, constant, non-radiating RLQ abdominal pain described as cramping with associated decreased appetite and no new vaginal discharge, any pain or bleeding that woke her from sleep yesterday. She reports that the pain is aggravated by walking and improved with rest. She states that she took 3 Aleve today with no improvement. She reports that her normal menses was 2 weeks ago and denies the possibility of pregnancy. She reports mild similarities with her normal menses pain but states that the menses pain is usually bilaterally located. She states that she has a colonscopy with a diagnosis of IBS in October 2013 but denies being on daily medications for IBS and denies similarities. She denies fevers, nausea, vaginal bleeding or pain, emesis, chills, urinary symptoms, back pain and diarrhea as associated symptoms. She denies having a h/o chronic medical conditions and denies being on daily medications. She denies smoking and alcohol use.  Dr. Rodena Medin with [place] [facility] [place] is PCP.   Past Medical History  Diagnosis Date  . Allergy     seasonal allergic rhinitis  . GERD (gastroesophageal reflux disease)   . Anemia     past history  . History of chicken pox   . Bursitis     bilateral hip R>L- sees ortho (uses tramadol & ibuprofen)  . Constipation   . Chronic headaches   . Thyroid disease   . Pre-diabetes     Past Surgical History  Procedure Laterality Date   . Breast reduction surgery  11/2005  . Foot surgery  05/2007    left foot  . Keloid excision      Family History  Problem Relation Age of Onset  . Diabetes Father   . Coronary artery disease Father   . Colon polyps Father   . Heart failure Father   . Hypertension Father   . Hyperlipidemia Father   . Ovarian cancer Maternal Grandmother   . Colon polyps Maternal Grandfather   . Colon cancer Neg Hx     History  Substance Use Topics  . Smoking status: Never Smoker   . Smokeless tobacco: Never Used  . Alcohol Use: No    No OB history provided.  Review of Systems  Constitutional:       Per HPI, otherwise negative  HENT:       Per HPI, otherwise negative  Respiratory:       Per HPI, otherwise negative  Cardiovascular:       Per HPI, otherwise negative  Gastrointestinal: Negative for vomiting.  Endocrine:       Negative aside from HPI  Genitourinary:       Neg aside from HPI   Musculoskeletal:       Per HPI, otherwise negative  Skin: Negative.   Neurological: Negative for syncope.    Allergies  Review of patient's allergies indicates no known allergies.  Home Medications   Current Outpatient Rx  Name  Route  Sig  Dispense  Refill  . BIOTIN PO   Oral   Take 2 each by mouth daily.         . cetirizine (ZYRTEC) 10 MG tablet   Oral   Take 10 mg by mouth daily as needed.         . docusate sodium (COLACE) 100 MG capsule   Oral   Take 100 mg by mouth daily as needed.           . ferrous sulfate 325 (65 FE) MG tablet   Oral   Take 1 tablet (325 mg total) by mouth daily.   90 tablet   3   . ibuprofen (ADVIL,MOTRIN) 200 MG tablet   Oral   Take 200 mg by mouth as needed.           . Linaclotide (LINZESS) 145 MCG CAPS   Oral   Take 1 capsule (145 mcg total) by mouth daily.   30 capsule   3   . loratadine (CLARITIN) 10 MG tablet   Oral   Take 10 mg by mouth daily as needed.           . Multiple Vitamins-Calcium (ONE-A-DAY WOMENS FORMULA)  TABS   Oral   Take 1 tablet by mouth daily.         . norgestimate-ethinyl estradiol (ORTHO-CYCLEN,SPRINTEC,PREVIFEM) 0.25-35 MG-MCG tablet   Oral   Take 1 tablet by mouth daily.   1 Package   6   . SUMAtriptan (IMITREX) 50 MG tablet   Oral   Take 1 tablet (50 mg total) by mouth every 2 (two) hours as needed for migraine. Maximum 200mg  in 24 hours   10 tablet   3   . traMADol (ULTRAM) 50 MG tablet   Oral   Take 1 tablet (50 mg total) by mouth every 6 (six) hours as needed.   30 tablet   1     Triage Vitals: BP 111/68  Pulse 77  Temp(Src) 98.4 F (36.9 C) (Oral)  SpO2 100%  LMP 07/12/2012  Physical Exam  Nursing note and vitals reviewed. Constitutional: She is oriented to person, place, and time. She appears well-developed and well-nourished. No distress.  HENT:  Head: Normocephalic and atraumatic.  Eyes: Conjunctivae and EOM are normal.  Cardiovascular: Normal rate and regular rhythm.   Pulmonary/Chest: Effort normal and breath sounds normal. No stridor. No respiratory distress.  Abdominal: Soft. She exhibits no distension. There is tenderness (RLQ tenderness, no suprapubic, no LLQ ).  Quite bowel sounds  Musculoskeletal: She exhibits no edema.  Neurological: She is alert and oriented to person, place, and time. No cranial nerve deficit.  Skin: Skin is warm and dry.  Psychiatric: She has a normal mood and affect.    ED Course  Procedures (including critical care time)  DIAGNOSTIC STUDIES: Oxygen Saturation is 100% on room air, normal by my interpretation.    COORDINATION OF CARE: 5:28 PM-Discussed treatment plan which includes CT scan of abdomen with pt at bedside and pt agreed to plan. Pt turned down pain meds  Labs Reviewed  URINALYSIS, ROUTINE W REFLEX MICROSCOPIC  PREGNANCY, URINE   No results found.   No diagnosis found.    MDM   I personally performed the services described in this documentation, which was scribed in my presence. The  recorded information has been reviewed and is accurate.   This generally well-appearing young female presents with right lower quadrant pain.  The patient denies any significant vaginal  complaints, is not pregnant and there is low suspicion for GU pathology.  The patient's CT scan demonstrates stool burden in the proximal colon, no other acute findings.  She was discharged in stable condition with stool softeners, PND/GI followup as needed.  Natasha Munch, MD 07/26/12 [phone]

## 2012-07-26 NOTE — ED Notes (Signed)
Abdominal pain. Right lower quad pain woke her yesterday from sleep. Hx of IBS with constipation.

## 2012-08-27 ENCOUNTER — Encounter: Payer: Self-pay | Admitting: Emergency Medicine

## 2012-08-27 ENCOUNTER — Emergency Department
Admission: EM | Admit: 2012-08-27 | Discharge: 2012-08-27 | Disposition: A | Discharge status: Home / Self Care | Payer: 59 | Source: Home / Self Care | Attending: Family Medicine | Admitting: Family Medicine

## 2012-08-27 ENCOUNTER — Emergency Department (INDEPENDENT_AMBULATORY_CARE_PROVIDER_SITE_OTHER): Payer: 59

## 2012-08-27 DIAGNOSIS — M25569 Pain in unspecified knee: Secondary | ICD-10-CM

## 2012-08-27 DIAGNOSIS — S86811A Strain of other muscle(s) and tendon(s) at lower leg level, right leg, initial encounter: Secondary | ICD-10-CM

## 2012-08-27 DIAGNOSIS — S8990XA Unspecified injury of unspecified lower leg, initial encounter: Secondary | ICD-10-CM

## 2012-08-27 DIAGNOSIS — S838X9A Sprain of other specified parts of unspecified knee, initial encounter: Secondary | ICD-10-CM

## 2012-08-27 DIAGNOSIS — X500XXA Overexertion from strenuous movement or load, initial encounter: Secondary | ICD-10-CM

## 2012-08-27 MED ORDER — TRAMADOL HCL 50 MG PO TABS: 50.0000 mg | ORAL_TABLET | Freq: Four times a day (QID) | ORAL | Status: DC | PRN

## 2012-08-27 NOTE — ED Provider Notes (Signed)
History     CSN: [id number]  Arrival date & time 08/27/12  1532   First MD Initiated Contact with Patient 08/27/12 1550      Chief Complaint  Patient presents with  . Knee Injury       HPI Comments: While walking on a treadmill yesterday, the machine suddenly stopped and patient lurched forward, injuring her right knee.  She has had persistent pain with weight bearing and walking.  Patient is a 37 y.o. female presenting with knee pain. The history is provided by the patient.  Knee Pain Lower extremity pain location: right knee. Time since incident:  1 day Pain details:    Quality:  Aching   Radiates to:  Does not radiate   Severity:  Moderate   Onset quality:  Sudden   Timing:  Constant   Progression:  Unchanged Chronicity:  New Dislocation: no   Prior injury to area:  No Relieved by:  Nothing Worsened by:  Bearing weight Ineffective treatments:  NSAIDs Associated symptoms: decreased ROM   Associated symptoms: no back pain, no swelling and no tingling   Risk factors: obesity     Past Medical History  Diagnosis Date  . Allergy     seasonal allergic rhinitis  . GERD (gastroesophageal reflux disease)   . Anemia     past history  . History of chicken pox   . Bursitis     bilateral hip R>L- sees ortho (uses tramadol & ibuprofen)  . Constipation   . Chronic headaches   . Thyroid disease   . Pre-diabetes     Past Surgical History  Procedure Laterality Date  . Breast reduction surgery  11/2005  . Foot surgery  05/2007    left foot  . Keloid excision      Family History  Problem Relation Age of Onset  . Diabetes Father   . Coronary artery disease Father   . Colon polyps Father   . Heart failure Father   . Hypertension Father   . Hyperlipidemia Father   . Ovarian cancer Maternal Grandmother   . Colon polyps Maternal Grandfather   . Colon cancer Neg Hx     History  Substance Use Topics  . Smoking status: Never Smoker   . Smokeless tobacco: Never Used    . Alcohol Use: No    OB History   Grav Para Term Preterm Abortions TAB SAB Ect Mult Living                  Review of Systems  Musculoskeletal: Negative for back pain.  All other systems reviewed and are negative.    Allergies  Review of patient's allergies indicates not on file.  Home Medications   Current Outpatient Rx  Name  Route  Sig  Dispense  Refill  . BIOTIN PO   Oral   Take 2 each by mouth daily.         . cetirizine (ZYRTEC) 10 MG tablet   Oral   Take 10 mg by mouth daily as needed.         . docusate sodium (COLACE) 100 MG capsule   Oral   Take 100 mg by mouth daily as needed.           . ferrous sulfate 325 (65 FE) MG tablet   Oral   Take 1 tablet (325 mg total) by mouth daily.   90 tablet   3   . ibuprofen (ADVIL,MOTRIN) 200 MG tablet  Oral   Take 200 mg by mouth as needed.           . Linaclotide (LINZESS) 145 MCG CAPS   Oral   Take 1 capsule (145 mcg total) by mouth daily.   30 capsule   3   . loratadine (CLARITIN) 10 MG tablet   Oral   Take 10 mg by mouth daily as needed.           . Multiple Vitamins-Calcium (ONE-A-DAY WOMENS FORMULA) TABS   Oral   Take 1 tablet by mouth daily.         . norgestimate-ethinyl estradiol (ORTHO-CYCLEN,SPRINTEC,PREVIFEM) 0.25-35 MG-MCG tablet   Oral   Take 1 tablet by mouth daily.   1 Package   6   . senna-docusate (SENOKOT-S) 8.6-50 MG per tablet   Oral   Take 2 tablets by mouth daily.   30 tablet   0   . SUMAtriptan (IMITREX) 50 MG tablet   Oral   Take 1 tablet (50 mg total) by mouth every 2 (two) hours as needed for migraine. Maximum 200mg  in 24 hours   10 tablet   3   . traMADol (ULTRAM) 50 MG tablet   Oral   Take 1 tablet (50 mg total) by mouth every 6 (six) hours as needed.   15 tablet   0     BP 109/70  Pulse 82  Temp(Src) 98.1 F (36.7 C) (Oral)  Ht 5' 6.5" (1.689 m)  Wt 255 lb (115.667 kg)  BMI 40.55 kg/m2  SpO2 98%  Physical Exam  Nursing note and  vitals reviewed. Constitutional: She is oriented to person, place, and time. She appears well-developed and well-nourished. No distress.  Patient is obese (BMI 40.6)  HENT:  Head: Atraumatic.  Eyes: Conjunctivae are normal. Pupils are equal, round, and reactive to light.  Musculoskeletal: She exhibits tenderness.       Right knee: She exhibits decreased range of motion. She exhibits no swelling, no effusion, no ecchymosis, no deformity, no laceration, no erythema, normal alignment, no LCL laxity, normal patellar mobility, no bony tenderness, normal meniscus and no MCL laxity. Tenderness found. Patellar tendon tenderness noted. No medial joint line, no lateral joint line, no MCL and no LCL tenderness noted.       Legs: There is distinct tenderness over the lower pole of the right patella, and right patellar tendon.  Neurological: She is alert and oriented to person, place, and time.  Skin: Skin is warm and dry.    ED Course  Procedures  none   Dg Knee Complete 4 Views Right  08/27/2012  *RADIOLOGY REPORT*  Clinical Data: Right knee pain after injury.  RIGHT KNEE - COMPLETE 4+ VIEW  Comparison:  None.  Findings: No fracture or dislocation.  IMPRESSION: No fracture.   Original Report Authenticated By: Lacy Duverney, M.D.      1. Patellar tendon strain, right, initial encounter       MDM  Hinged knee brace applied.  Dispensed crutches. Rx for Tramadol. Apply ice pack 3 to 4 times daily.  Wear knee brace daytime.  Use crutches for about 3 to 5 days.  Begin knee exercises as per instruction sheet ([facility] information and instruction handout given)  Followup with Dr. Rodney Langton if not improved about 10 days.        Lattie Haw, MD 08/27/12 [phone]

## 2012-08-27 NOTE — ED Notes (Signed)
Right knee injury, yesterday while running on treadmill and the machine stopped abruptly, felt pain

## 2012-09-16 ENCOUNTER — Ambulatory Visit (INDEPENDENT_AMBULATORY_CARE_PROVIDER_SITE_OTHER): Payer: 59 | Admitting: Family Medicine

## 2012-09-16 ENCOUNTER — Encounter: Payer: Self-pay | Admitting: Family Medicine

## 2012-09-16 VITALS — BP 97/64 | HR 85 | Ht 67.0 in | Wt 249.0 lb

## 2012-09-16 DIAGNOSIS — M25561 Pain in right knee: Secondary | ICD-10-CM

## 2012-09-16 DIAGNOSIS — M25569 Pain in unspecified knee: Secondary | ICD-10-CM

## 2012-09-16 NOTE — Patient Instructions (Addendum)
You have strained your patellar tendon but also have a contusion behind your kneecap. Physical therapy is the most important part of your treatment - do home exercises on days you do not go to therapy for next 6 weeks. Ice knee 15 minutes at a time 3-4 times a day. Aleve 2 tabs twice a day with food for pain and inflammation. Knee brace as needed for support. Start with recumbent bike for exercise at low resistance (swimming ok too).   As long as you're not limping afterward and pain is less than a 3 on a scale of 1-10 you can advance activities - to elliptical then walking then eventually running. I expect this to occur over next 4-6 weeks. Follow up with me in 1 month for reevaluation.

## 2012-09-20 ENCOUNTER — Encounter: Payer: Self-pay | Admitting: Family Medicine

## 2012-09-20 DIAGNOSIS — M25561 Pain in right knee: Secondary | ICD-10-CM | POA: Insufficient documentation

## 2012-09-20 NOTE — Assessment & Plan Note (Signed)
consistent with patellar tendon strain.  Ultrasound does not show a defect within the tendon.  Patellar contusion vs subluxation a secondary diagnosis.  Start with PT, icing, nsaids.  Brace only as needed.  Start with recumbent bike for exercise - advance to elliptical then walking then running when able (can do so with PT guidance).  F/u in 1 month.

## 2012-09-20 NOTE — Progress Notes (Signed)
Subjective:    Patient ID: Natasha Hall, female    DOB: 18-Sep-1975, 37 y.o.   MRN: [id number]  PCP: Dr. Rodena Medin  HPI 37 yo F here for right knee injury.  Patient reports she was running on a treadmill on 3/20 - alternating off and on at . While running at it cut off suddenly causing her to sustain sudden pull to anterior right knee. Seemed ok initially but when stepped off treadmill had pretty bad pain anterior knee. No swelling or bruising. Had x-rays that were negative. Feels rubbing under kneecap. Taking aleve. Used crutches for first 5 days then knee brace for about 10 days. Still with pain. Not doing any specific rehab for this.  Past Medical History  Diagnosis Date  . Allergy     seasonal allergic rhinitis  . GERD (gastroesophageal reflux disease)   . Anemia     past history  . History of chicken pox   . Bursitis     bilateral hip R>L- sees ortho (uses tramadol & ibuprofen)  . Constipation   . Chronic headaches   . Thyroid disease   . Pre-diabetes     Current Outpatient Prescriptions on File Prior to Visit  Medication Sig Dispense Refill  . cetirizine (ZYRTEC) 10 MG tablet Take 10 mg by mouth daily as needed.      . traMADol (ULTRAM) 50 MG tablet Take 1 tablet (50 mg total) by mouth every 6 (six) hours as needed.  15 tablet  0  . BIOTIN PO Take 2 each by mouth daily.      Marland Kitchen docusate sodium (COLACE) 100 MG capsule Take 100 mg by mouth daily as needed.        . ferrous sulfate 325 (65 FE) MG tablet Take 1 tablet (325 mg total) by mouth daily.  90 tablet  3  . ibuprofen (ADVIL,MOTRIN) 200 MG tablet Take 200 mg by mouth as needed.        . Linaclotide (LINZESS) 145 MCG CAPS Take 1 capsule (145 mcg total) by mouth daily.  30 capsule  3  . Multiple Vitamins-Calcium (ONE-A-DAY WOMENS FORMULA) TABS Take 1 tablet by mouth daily.      . norgestimate-ethinyl estradiol (ORTHO-CYCLEN,SPRINTEC,PREVIFEM) 0.25-35 MG-MCG tablet Take 1 tablet by mouth daily.  1 Package  6   . senna-docusate (SENOKOT-S) 8.6-50 MG per tablet Take 2 tablets by mouth daily.  30 tablet  0  . SUMAtriptan (IMITREX) 50 MG tablet Take 1 tablet (50 mg total) by mouth every 2 (two) hours as needed for migraine. Maximum 200mg  in 24 hours  10 tablet  3   No current facility-administered medications on file prior to visit.    Past Surgical History  Procedure Laterality Date  . Breast reduction surgery  11/2005  . Foot surgery  05/2007    left foot  . Keloid excision      No Known Allergies  History   Social History  . Marital Status: Single    Spouse Name: N/A    Number of Children: 0  . Years of Education: N/A   Occupational History  . Admission Service Associate     [employer]   Social History Main Topics  . Smoking status: Never Smoker   . Smokeless tobacco: Never Used  . Alcohol Use: No  . Drug Use: No  . Sexually Active: Not on file   Other Topics Concern  . Not on file   Social History Narrative   Lives with god  sister   Daily caffeine     Family History  Problem Relation Age of Onset  . Diabetes Father   . Coronary artery disease Father   . Colon polyps Father   . Heart failure Father   . Hypertension Father   . Hyperlipidemia Father   . Heart attack Father   . Ovarian cancer Maternal Grandmother   . Colon polyps Maternal Grandfather   . Colon cancer Neg Hx   . Sudden death Neg Hx     BP 97/64  Pulse 85  Ht 5\' 7"  (1.702 m)  Wt 249 lb (112.946 kg)  BMI 38.99 kg/m2  Review of Systems See HPI above.    Objective:   Physical Exam Gen: NAD  R knee: No gross deformity, ecchymoses, swelling, bruising, defect. TTP patellar tendon reproducing her pain.  Less tenderness post patellar facets.  No joint line or other TTP. FROM with pain on active extension. Negative ant/post drawers. Negative valgus/varus testing. Negative lachmanns. Negative mcmurrays, apleys, patellar apprehension. NV intact distally.     Assessment & Plan:  1. Right knee  pain - consistent with patellar tendon strain.  Ultrasound does not show a defect within the tendon.  Patellar contusion vs subluxation a secondary diagnosis.  Start with PT, icing, nsaids.  Brace only as needed.  Start with recumbent bike for exercise - advance to elliptical then walking then running when able (can do so with PT guidance).  F/u in 1 month.

## 2012-09-21 ENCOUNTER — Ambulatory Visit: Payer: 59 | Attending: Family Medicine | Admitting: Physical Therapy

## 2012-09-21 DIAGNOSIS — M6281 Muscle weakness (generalized): Secondary | ICD-10-CM | POA: Insufficient documentation

## 2012-09-21 DIAGNOSIS — IMO0001 Reserved for inherently not codable concepts without codable children: Secondary | ICD-10-CM | POA: Insufficient documentation

## 2012-09-21 DIAGNOSIS — M25569 Pain in unspecified knee: Secondary | ICD-10-CM | POA: Insufficient documentation

## 2012-09-28 ENCOUNTER — Ambulatory Visit: Payer: 59 | Admitting: Physical Therapy

## 2012-10-05 ENCOUNTER — Encounter: Payer: 59 | Admitting: Physical Therapy

## 2012-10-06 ENCOUNTER — Other Ambulatory Visit: Payer: Self-pay | Admitting: Internal Medicine

## 2012-10-06 ENCOUNTER — Ambulatory Visit: Payer: 59 | Admitting: Physical Therapy

## 2012-10-12 ENCOUNTER — Ambulatory Visit (INDEPENDENT_AMBULATORY_CARE_PROVIDER_SITE_OTHER): Payer: 59 | Admitting: Family Medicine

## 2012-10-12 ENCOUNTER — Ambulatory Visit: Payer: 59 | Admitting: Family Medicine

## 2012-10-12 ENCOUNTER — Ambulatory Visit: Payer: 59 | Attending: Family Medicine | Admitting: Physical Therapy

## 2012-10-12 ENCOUNTER — Encounter: Payer: Self-pay | Admitting: Family Medicine

## 2012-10-12 VITALS — BP 108/73 | HR 83 | Ht 67.0 in | Wt 255.0 lb

## 2012-10-12 DIAGNOSIS — M25569 Pain in unspecified knee: Secondary | ICD-10-CM

## 2012-10-12 DIAGNOSIS — M25561 Pain in right knee: Secondary | ICD-10-CM

## 2012-10-12 DIAGNOSIS — IMO0001 Reserved for inherently not codable concepts without codable children: Secondary | ICD-10-CM | POA: Insufficient documentation

## 2012-10-12 DIAGNOSIS — M6281 Muscle weakness (generalized): Secondary | ICD-10-CM | POA: Insufficient documentation

## 2012-10-12 NOTE — Patient Instructions (Addendum)
Continue with physical therapy and home exercises. Patellar strap may help with pain when up and walking around. Icing, aleve as needed. Call if you're worsening or plateauing with pain in front of your knee - would then start nitro patches as we discussed. Otherwise follow up with me in 6 weeks.

## 2012-10-13 ENCOUNTER — Encounter: Payer: Self-pay | Admitting: Family Medicine

## 2012-10-13 NOTE — Assessment & Plan Note (Signed)
consistent with patellar tendon strain.  Doing well with physical therapy, home exercises, taping.  Add patellar tendon strap.  Continue PT, home exercises, icing, nsaids.  Start walking, elliptical.  F/u in 6 weeks for reevaluation.  Discussed nitro patches though she declined at this time - call us if she wants to try these.

## 2012-10-13 NOTE — Progress Notes (Signed)
Subjective:    Patient ID: Natasha Hall, female    DOB: 10/21/1975, 37 y.o.   MRN: [id number]  PCP: Dr. Rodena Medin  HPI  37 yo F here for f/u right knee injury.  4/10: Patient reports she was running on a treadmill on 3/20 - alternating off and on at . While running at it cut off suddenly causing her to sustain sudden pull to anterior right knee. Seemed ok initially but when stepped off treadmill had pretty bad pain anterior knee. No swelling or bruising. Had x-rays that were negative. Feels rubbing under kneecap. Taking aleve. Used crutches for first 5 days then knee brace for about 10 days. Still with pain. Not doing any specific rehab for this.  5/6: Patient reports she is improving with PT, taping with kinesiotape. Feels sore today following PT. Pain still primarily anterior knee under kneecap. Has been icing, taking aleve. Just cleared by PT to start some walking and elliptical.  Past Medical History  Diagnosis Date  . Allergy     seasonal allergic rhinitis  . GERD (gastroesophageal reflux disease)   . Anemia     past history  . History of chicken pox   . Bursitis     bilateral hip R>L- sees ortho (uses tramadol & ibuprofen)  . Constipation   . Chronic headaches   . Thyroid disease   . Pre-diabetes     Current Outpatient Prescriptions on File Prior to Visit  Medication Sig Dispense Refill  . BIOTIN PO Take 2 each by mouth daily.      . cetirizine (ZYRTEC) 10 MG tablet Take 10 mg by mouth daily as needed.      . docusate sodium (COLACE) 100 MG capsule Take 100 mg by mouth daily as needed.        . ferrous sulfate 325 (65 FE) MG tablet Take 1 tablet (325 mg total) by mouth daily.  90 tablet  3  . ibuprofen (ADVIL,MOTRIN) 200 MG tablet Take 200 mg by mouth as needed.        . Linaclotide (LINZESS) 145 MCG CAPS Take 1 capsule (145 mcg total) by mouth daily.  30 capsule  3  . Multiple Vitamins-Calcium (ONE-A-DAY WOMENS FORMULA) TABS Take 1 tablet by mouth  daily.      . norgestimate-ethinyl estradiol (ORTHO-CYCLEN,SPRINTEC,PREVIFEM) 0.25-35 MG-MCG tablet Take 1 tablet by mouth daily.  1 Package  6  . senna-docusate (SENOKOT-S) 8.6-50 MG per tablet Take 2 tablets by mouth daily.  30 tablet  0  . SUMAtriptan (IMITREX) 50 MG tablet Take 1 tablet (50 mg total) by mouth every 2 (two) hours as needed for migraine.  10 tablet  3  . traMADol (ULTRAM) 50 MG tablet Take 1 tablet (50 mg total) by mouth every 6 (six) hours as needed.  15 tablet  0   No current facility-administered medications on file prior to visit.    Past Surgical History  Procedure Laterality Date  . Breast reduction surgery  11/2005  . Foot surgery  05/2007    left foot  . Keloid excision      No Known Allergies  History   Social History  . Marital Status: Single    Spouse Name: N/A    Number of Children: 0  . Years of Education: N/A   Occupational History  . Admission Service Associate     [employer]   Social History Main Topics  . Smoking status: Never Smoker   . Smokeless tobacco: Never Used  .  Alcohol Use: No  . Drug Use: No  . Sexually Active: Not on file   Other Topics Concern  . Not on file   Social History Narrative   Lives with god sister   Daily caffeine     Family History  Problem Relation Age of Onset  . Diabetes Father   . Coronary artery disease Father   . Colon polyps Father   . Heart failure Father   . Hypertension Father   . Hyperlipidemia Father   . Heart attack Father   . Ovarian cancer Maternal Grandmother   . Colon polyps Maternal Grandfather   . Colon cancer Neg Hx   . Sudden death Neg Hx     BP 108/73  Pulse 83  Ht 5\' 7"  (1.702 m)  Wt 255 lb (115.667 kg)  BMI 39.93 kg/m2  Review of Systems  See HPI above.    Objective:   Physical Exam  Gen: NAD  R knee: No gross deformity, ecchymoses, swelling, bruising, defect. TTP patellar tendon reproducing her pain.  No tenderness post patellar facets.  No joint line or  other TTP. FROM with pain on active extension. Negative ant/post drawers. Negative valgus/varus testing. Negative lachmanns. Negative mcmurrays, apleys, patellar apprehension. NV intact distally.     Assessment & Plan:  1. Right knee pain - consistent with patellar tendon strain.  Doing well with physical therapy, home exercises, taping.  Add patellar tendon strap.  Continue PT, home exercises, icing, nsaids.  Start walking, elliptical.  F/u in 6 weeks for reevaluation.  Discussed nitro patches though she declined at this time - call us if she wants to try these.

## 2012-10-19 ENCOUNTER — Ambulatory Visit: Payer: 59 | Admitting: Physical Therapy

## 2012-10-27 ENCOUNTER — Ambulatory Visit: Payer: 59 | Admitting: Physical Therapy

## 2012-11-02 ENCOUNTER — Ambulatory Visit: Payer: 59 | Admitting: Physical Therapy

## 2012-11-09 ENCOUNTER — Ambulatory Visit: Payer: 59 | Attending: Family Medicine | Admitting: Physical Therapy

## 2012-11-09 DIAGNOSIS — M25569 Pain in unspecified knee: Secondary | ICD-10-CM | POA: Insufficient documentation

## 2012-11-09 DIAGNOSIS — IMO0001 Reserved for inherently not codable concepts without codable children: Secondary | ICD-10-CM | POA: Insufficient documentation

## 2012-11-09 DIAGNOSIS — M6281 Muscle weakness (generalized): Secondary | ICD-10-CM | POA: Insufficient documentation

## 2012-11-16 ENCOUNTER — Ambulatory Visit: Payer: 59 | Admitting: Physical Therapy

## 2012-11-23 ENCOUNTER — Ambulatory Visit (INDEPENDENT_AMBULATORY_CARE_PROVIDER_SITE_OTHER): Payer: 59 | Admitting: Family Medicine

## 2012-11-23 ENCOUNTER — Encounter: Payer: Self-pay | Admitting: Family Medicine

## 2012-11-23 VITALS — BP 115/77 | HR 84 | Ht 67.0 in | Wt 260.0 lb

## 2012-11-23 DIAGNOSIS — M25569 Pain in unspecified knee: Secondary | ICD-10-CM

## 2012-11-23 DIAGNOSIS — M25561 Pain in right knee: Secondary | ICD-10-CM

## 2012-11-23 NOTE — Assessment & Plan Note (Signed)
2/2 patellar tendon strain, tendinopathy.  Much improved.  Continue home exercise program for next 6 weeks.  Icing, nsaids, patellar strap as needed.  Exercise as tolerated.  F/u prn.

## 2012-11-23 NOTE — Progress Notes (Signed)
Subjective:    Patient ID: Natasha Hall, female    DOB: 01-12-1976, 37 y.o.   MRN: [id number]  PCP: Dr. Rodena Medin  HPI  37 yo F here for f/u right knee injury.  4/10: Patient reports she was running on a treadmill on 3/20 - alternating off and on at . While running at it cut off suddenly causing her to sustain sudden pull to anterior right knee. Seemed ok initially but when stepped off treadmill had pretty bad pain anterior knee. No swelling or bruising. Had x-rays that were negative. Feels rubbing under kneecap. Taking aleve. Used crutches for first 5 days then knee brace for about 10 days. Still with pain. Not doing any specific rehab for this.  5/6: Patient reports she is improving with PT, taping with kinesiotape. Feels sore today following PT. Pain still primarily anterior knee under kneecap. Has been icing, taking aleve. Just cleared by PT to start some walking and elliptical.  6/17: Patient is doing much better. Only issues at this point are if she bends down and puts pressure directly on her right knee. Able to walk, use elliptical, do squats, lunges without pain. Done with PT and doing home exercise program. No longer taking medications, icing, or using strap.  Past Medical History  Diagnosis Date  . Allergy     seasonal allergic rhinitis  . GERD (gastroesophageal reflux disease)   . Anemia     past history  . History of chicken pox   . Bursitis     bilateral hip R>L- sees ortho (uses tramadol & ibuprofen)  . Constipation   . Chronic headaches   . Thyroid disease   . Pre-diabetes     Current Outpatient Prescriptions on File Prior to Visit  Medication Sig Dispense Refill  . BIOTIN PO Take 2 each by mouth daily.      . cetirizine (ZYRTEC) 10 MG tablet Take 10 mg by mouth daily as needed.      . docusate sodium (COLACE) 100 MG capsule Take 100 mg by mouth daily as needed.        . ferrous sulfate 325 (65 FE) MG tablet Take 1 tablet (325 mg total)  by mouth daily.  90 tablet  3  . ibuprofen (ADVIL,MOTRIN) 200 MG tablet Take 200 mg by mouth as needed.        . Linaclotide (LINZESS) 145 MCG CAPS Take 1 capsule (145 mcg total) by mouth daily.  30 capsule  3  . Multiple Vitamins-Calcium (ONE-A-DAY WOMENS FORMULA) TABS Take 1 tablet by mouth daily.      . norgestimate-ethinyl estradiol (ORTHO-CYCLEN,SPRINTEC,PREVIFEM) 0.25-35 MG-MCG tablet Take 1 tablet by mouth daily.  1 Package  6  . senna-docusate (SENOKOT-S) 8.6-50 MG per tablet Take 2 tablets by mouth daily.  30 tablet  0  . SUMAtriptan (IMITREX) 50 MG tablet Take 1 tablet (50 mg total) by mouth every 2 (two) hours as needed for migraine.  10 tablet  3  . traMADol (ULTRAM) 50 MG tablet Take 1 tablet (50 mg total) by mouth every 6 (six) hours as needed.  15 tablet  0   No current facility-administered medications on file prior to visit.    Past Surgical History  Procedure Laterality Date  . Breast reduction surgery  11/2005  . Foot surgery  05/2007    left foot  . Keloid excision      No Known Allergies  History   Social History  . Marital Status: Single  Spouse Name: N/A    Number of Children: 0  . Years of Education: N/A   Occupational History  . Admission Service Associate     [employer]   Social History Main Topics  . Smoking status: Never Smoker   . Smokeless tobacco: Never Used  . Alcohol Use: No  . Drug Use: No  . Sexually Active: Not on file   Other Topics Concern  . Not on file   Social History Narrative   Lives with god sister   Daily caffeine     Family History  Problem Relation Age of Onset  . Diabetes Father   . Coronary artery disease Father   . Colon polyps Father   . Heart failure Father   . Hypertension Father   . Hyperlipidemia Father   . Heart attack Father   . Ovarian cancer Maternal Grandmother   . Colon polyps Maternal Grandfather   . Colon cancer Neg Hx   . Sudden death Neg Hx     BP 115/77  Pulse 84  Ht 5\' 7"  (1.702 m)   Wt 260 lb (117.935 kg)  BMI 40.71 kg/m2  Review of Systems  See HPI above.    Objective:   Physical Exam  Gen: NAD  R knee: No gross deformity, ecchymoses, swelling, bruising, defect. Minimal TTP inferior patellar pole at patellar tendon insertion.  No tenderness post patellar facets.  No joint line or other TTP. FROM without pain on active extension. Negative ant/post drawers. Negative valgus/varus testing. Negative lachmanns. Negative mcmurrays, apleys, patellar apprehension. NV intact distally.     Assessment & Plan:  1. Right knee pain - 2/2 patellar tendon strain, tendinopathy.  Much improved.  Continue home exercise program for next 6 weeks.  Icing, nsaids, patellar strap as needed.  Exercise as tolerated.  F/u prn.

## 2013-02-09 ENCOUNTER — Ambulatory Visit (INDEPENDENT_AMBULATORY_CARE_PROVIDER_SITE_OTHER): Payer: 59 | Admitting: Physician Assistant

## 2013-02-09 ENCOUNTER — Encounter: Payer: Self-pay | Admitting: Physician Assistant

## 2013-02-09 VITALS — BP 100/76 | HR 79 | Temp 98.4°F | Resp 16 | Wt 259.8 lb

## 2013-02-09 DIAGNOSIS — R5383 Other fatigue: Secondary | ICD-10-CM | POA: Insufficient documentation

## 2013-02-09 DIAGNOSIS — R5381 Other malaise: Secondary | ICD-10-CM

## 2013-02-09 DIAGNOSIS — E049 Nontoxic goiter, unspecified: Secondary | ICD-10-CM

## 2013-02-09 LAB — CBC WITH DIFFERENTIAL/PLATELET
Basophils Absolute: 0 10*3/uL (ref 0.0–0.1)
Eosinophils Relative: 3 % (ref 0–5)
Lymphocytes Relative: 30 % (ref 12–46)
Lymphs Abs: 1.7 10*3/uL (ref 0.7–4.0)
MCV: 79.4 fL (ref 78.0–100.0)
Neutro Abs: 3.2 10*3/uL (ref 1.7–7.7)
Neutrophils Relative %: 60 % (ref 43–77)
Platelets: 348 10*3/uL (ref 150–400)
RBC: 4.08 MIL/uL (ref 3.87–5.11)
RDW: 15.1 % (ref 11.5–15.5)
WBC: 5.4 10*3/uL (ref 4.0–10.5)

## 2013-02-09 LAB — BASIC METABOLIC PANEL
BUN: 6 mg/dL (ref 6–23)
Calcium: 9.7 mg/dL (ref 8.4–10.5)
Chloride: 101 mEq/L (ref 96–112)
Creat: 0.77 mg/dL (ref 0.50–1.10)

## 2013-02-09 LAB — T4, FREE: Free T4: 0.97 ng/dL (ref 0.80–1.80)

## 2013-02-09 LAB — TSH: TSH: 1.004 u[IU]/mL (ref 0.350–4.500)

## 2013-02-09 LAB — FERRITIN: Ferritin: 13 ng/mL (ref 10–291)

## 2013-02-09 NOTE — Assessment & Plan Note (Signed)
Recheck TFTs.  Patient to follow-up with ENT depending on lab results.

## 2013-02-09 NOTE — Assessment & Plan Note (Signed)
Possibly multifactorial given patient's history.  Will check labs -- CBC, BMP, TSH, free T4, UA with micro, Ferritin.  Encourage patient to drink plenty of fluids, and continue with her exercise regimen.

## 2013-02-09 NOTE — Patient Instructions (Signed)
Please get plenty of fluids.  Obtain labs today.  We will call you with the results and treat you accordingly.  You need to schedule an appointment for an annual exam because you are overdue.  Fatigue Fatigue is a feeling of tiredness, lack of energy, lack of motivation, or feeling tired all the time. Having enough rest, good nutrition, and reducing stress will normally reduce fatigue. Consult your caregiver if it persists. The nature of your fatigue will help your caregiver to find out its cause. The treatment is based on the cause.  CAUSES  There are many causes for fatigue. Most of the time, fatigue can be traced to one or more of your habits or routines. Most causes fit into one or more of three general areas. They are: Lifestyle problems  Sleep disturbances.  Overwork.  Physical exertion.  Unhealthy habits.  Poor eating habits or eating disorders.  Alcohol and/or drug use .  Lack of proper nutrition (malnutrition). Psychological problems  Stress and/or anxiety problems.  Depression.  Grief.  Boredom. Medical Problems or Conditions  Anemia.  Pregnancy.  Thyroid gland problems.  Recovery from major surgery.  Continuous pain.  Emphysema or asthma that is not well controlled  Allergic conditions.  Diabetes.  Infections (such as mononucleosis).  Obesity.  Sleep disorders, such as sleep apnea.  Heart failure or other heart-related problems.  Cancer.  Kidney disease.  Liver disease.  Effects of certain medicines such as antihistamines, cough and cold remedies, prescription pain medicines, heart and blood pressure medicines, drugs used for treatment of cancer, and some antidepressants. SYMPTOMS  The symptoms of fatigue include:   Lack of energy.  Lack of drive (motivation).  Drowsiness.  Feeling of indifference to the surroundings. DIAGNOSIS  The details of how you feel help guide your caregiver in finding out what is causing the fatigue. You  will be asked about your present and past health condition. It is important to review all medicines that you take, including prescription and non-prescription items. A thorough exam will be done. You will be questioned about your feelings, habits, and normal lifestyle. Your caregiver may suggest blood tests, urine tests, or other tests to look for common medical causes of fatigue.  TREATMENT  Fatigue is treated by correcting the underlying cause. For example, if you have continuous pain or depression, treating these causes will improve how you feel. Similarly, adjusting the dose of certain medicines will help in reducing fatigue.  HOME CARE INSTRUCTIONS   Try to get the required amount of good sleep every night.  Eat a healthy and nutritious diet, and drink enough water throughout the day.  Practice ways of relaxing (including yoga or meditation).  Exercise regularly.  Make plans to change situations that cause stress. Act on those plans so that stresses decrease over time. Keep your work and personal routine reasonable.  Avoid street drugs and minimize use of alcohol.  Start taking a daily multivitamin after consulting your caregiver. SEEK MEDICAL CARE IF:   You have persistent tiredness, which cannot be accounted for.  You have fever.  You have unintentional weight loss.  You have headaches.  You have disturbed sleep throughout the night.  You are feeling sad.  You have constipation.  You have dry skin.  You have gained weight.  You are taking any new or different medicines that you suspect are causing fatigue.  You are unable to sleep at night.  You develop any unusual swelling of your legs or other parts of  your body. SEEK IMMEDIATE MEDICAL CARE IF:   You are feeling confused.  Your vision is blurred.  You feel faint or pass out.  You develop severe headache.  You develop severe abdominal, pelvic, or back pain.  You develop chest pain, shortness of breath,  or an irregular or fast heartbeat.  You are unable to pass a normal amount of urine.  You develop abnormal bleeding such as bleeding from the rectum or you vomit blood.  You have thoughts about harming yourself or committing suicide.  You are worried that you might harm someone else. MAKE SURE YOU:   Understand these instructions.  Will watch your condition.  Will get help right away if you are not doing well or get worse. Document Released: 03/23/2007 Document Revised: 08/18/2011 Document Reviewed: 03/23/2007 [facility] Patient Information 2014 [place], [place].

## 2013-02-09 NOTE — Progress Notes (Signed)
Patient ID: Natasha Hall, female   DOB: [birth year], 37 y.o.   MRN: [id number]  Patient presents to clinic today c/o fatigue x 2 weeks.  Information was obtained from the patient.  Patient states that over the past 2 weeks she has noticed a significant decrease in her energy levels.  States she feels tired all of the time.  Denies recent illness, shortness of breath.  Endorses history of thyroid goiter, which she feels has enlarged.  Prior workup of goiter, including [place] and needle aspiration, as well as serial TFT's were all negative.  Patient refused surgery in the past because she is prone to keloid formation.  Has not seen ENT in several months.  Patient endorses that she has started a new exercise program and that could be contributory.  Also endorses history of iron deficiency anemia requiring supplementation in the past.  Patient's LMP finished 2.5 weeks ago and was slightly heavy.  Denies fevers, chills, N/V/D, hematochezia or melena.  Has chronic constipation for which she uses fiber supplementation and magnesium citrate occasionally.  Last BM 1 day ago and normal.    Past Medical History  Diagnosis Date  . Allergy     seasonal allergic rhinitis  . GERD (gastroesophageal reflux disease)   . Anemia     past history  . History of chicken pox   . Bursitis     bilateral hip R>L- sees ortho (uses tramadol & ibuprofen)  . Constipation   . Chronic headaches   . Thyroid disease   . Pre-diabetes     Current Outpatient Prescriptions on File Prior to Visit  Medication Sig Dispense Refill  . BIOTIN PO Take 2 each by mouth daily.      . cetirizine (ZYRTEC) 10 MG tablet Take 10 mg by mouth daily as needed.      Marland Kitchen ibuprofen (ADVIL,MOTRIN) 200 MG tablet Take 200 mg by mouth as needed.        . Linaclotide (LINZESS) 145 MCG CAPS Take 1 capsule (145 mcg total) by mouth daily.  30 capsule  3  . Multiple Vitamins-Calcium (ONE-A-DAY WOMENS FORMULA) TABS Take 1 tablet by mouth daily.      . SUMAtriptan  (IMITREX) 50 MG tablet Take 1 tablet (50 mg total) by mouth every 2 (two) hours as needed for migraine.  10 tablet  3  . traMADol (ULTRAM) 50 MG tablet Take 1 tablet (50 mg total) by mouth every 6 (six) hours as needed.  15 tablet  0  . docusate sodium (COLACE) 100 MG capsule Take 100 mg by mouth daily as needed.        . ferrous sulfate 325 (65 FE) MG tablet Take 1 tablet (325 mg total) by mouth daily.  90 tablet  3   No current facility-administered medications on file prior to visit.    No Known Allergies  Family History  Problem Relation Age of Onset  . Diabetes Father   . Coronary artery disease Father   . Colon polyps Father   . Heart failure Father   . Hypertension Father   . Hyperlipidemia Father   . Heart attack Father   . Ovarian cancer Maternal Grandmother   . Colon polyps Maternal Grandfather   . Colon cancer Neg Hx   . Sudden death Neg Hx     History   Social History  . Marital Status: Single    Spouse Name: N/A    Number of Children: 0  . Years of Education: N/A  Occupational History  . Admission Service Associate     [employer]   Social History Main Topics  . Smoking status: Never Smoker   . Smokeless tobacco: Never Used  . Alcohol Use: No  . Drug Use: No  . Sexual Activity: None   Other Topics Concern  . None   Social History Narrative   Lives with god sister   Daily caffeine    Review of Systems  Constitutional: Positive for malaise/fatigue. Negative for fever, chills and weight loss.  Respiratory: Negative for cough, shortness of breath and wheezing.   Cardiovascular: Negative for chest pain and palpitations.  Gastrointestinal: Positive for constipation. Negative for nausea, vomiting, abdominal pain, diarrhea, blood in stool and melena.  Genitourinary: Negative for dysuria, urgency, frequency, hematuria and flank pain.  Musculoskeletal: Negative for myalgias and back pain.  Endo/Heme/Allergies: Does not bruise/bleed easily.   Psychiatric/Behavioral: Negative for depression.   Filed Vitals:   02/09/13 0848  BP: 100/76  Pulse: 79  Temp: 98.4 F (36.9 C)  Resp: 16   Physical Exam  Vitals reviewed. Constitutional: She is oriented to person, place, and time and well-developed, well-nourished, and in no distress.  HENT:  Head: Normocephalic and atraumatic.  Right Ear: External ear normal.  Left Ear: External ear normal.  Nose: Nose normal.  Mouth/Throat: Oropharynx is clear and moist. No oropharyngeal exudate.  Eyes: Conjunctivae are normal. Pupils are equal, round, and reactive to light.  Neck: Normal range of motion. Neck supple.  Positive enlarged goiter without palpable nodules.  Cardiovascular: Normal rate, regular rhythm, normal heart sounds and intact distal pulses.   Pulmonary/Chest: Effort normal and breath sounds normal. No respiratory distress.  Abdominal: Soft. Bowel sounds are normal.  Lymphadenopathy:    She has no cervical adenopathy.  Neurological: She is alert and oriented to person, place, and time. She has normal reflexes.  Skin: Skin is warm and dry. No rash noted.  Good skin turgor   Assessment/Plan: Fatigue Possibly multifactorial given patient's history.  Will check labs -- CBC, BMP, TSH, free T4, UA with micro, Ferritin.  Encourage patient to drink plenty of fluids, and continue with her exercise regimen.  Thyroid enlargement Recheck TFTs.  Patient to follow-up with ENT depending on lab results.

## 2013-02-10 ENCOUNTER — Other Ambulatory Visit: Payer: Self-pay | Admitting: Internal Medicine

## 2013-02-10 LAB — URINALYSIS, ROUTINE W REFLEX MICROSCOPIC
Hgb urine dipstick: NEGATIVE
Ketones, ur: NEGATIVE mg/dL
Leukocytes, UA: NEGATIVE
Nitrite: NEGATIVE
Protein, ur: NEGATIVE mg/dL
Urobilinogen, UA: 0.2 mg/dL (ref 0.0–1.0)
pH: 6 (ref 5.0–8.0)

## 2013-02-10 MED ORDER — NORGESTIMATE-ETH ESTRADIOL 0.25-35 MG-MCG PO TABS: 1.0000 | ORAL_TABLET | Freq: Every day | ORAL | Status: AC

## 2013-02-10 NOTE — Telephone Encounter (Signed)
Rx request to pharmacy/[facility]  

## 2013-02-11 ENCOUNTER — Encounter: Payer: Self-pay | Admitting: Physician Assistant

## 2013-02-11 ENCOUNTER — Other Ambulatory Visit (HOSPITAL_COMMUNITY)
Admission: RE | Admit: 2013-02-11 | Discharge: 2013-02-11 | Disposition: A | Discharge status: Home / Self Care | Payer: 59 | Source: Ambulatory Visit | Attending: Physician Assistant | Admitting: Physician Assistant

## 2013-02-11 ENCOUNTER — Ambulatory Visit (INDEPENDENT_AMBULATORY_CARE_PROVIDER_SITE_OTHER): Payer: 59 | Admitting: Physician Assistant

## 2013-02-11 VITALS — BP 104/78 | HR 80 | Temp 98.0°F | Resp 16 | Ht 67.0 in | Wt 259.0 lb

## 2013-02-11 DIAGNOSIS — K219 Gastro-esophageal reflux disease without esophagitis: Secondary | ICD-10-CM

## 2013-02-11 DIAGNOSIS — R5381 Other malaise: Secondary | ICD-10-CM

## 2013-02-11 DIAGNOSIS — Z124 Encounter for screening for malignant neoplasm of cervix: Secondary | ICD-10-CM

## 2013-02-11 DIAGNOSIS — E049 Nontoxic goiter, unspecified: Secondary | ICD-10-CM

## 2013-02-11 DIAGNOSIS — Z01419 Encounter for gynecological examination (general) (routine) without abnormal findings: Secondary | ICD-10-CM | POA: Insufficient documentation

## 2013-02-11 DIAGNOSIS — J301 Allergic rhinitis due to pollen: Secondary | ICD-10-CM

## 2013-02-11 DIAGNOSIS — R51 Headache: Secondary | ICD-10-CM

## 2013-02-11 DIAGNOSIS — K5900 Constipation, unspecified: Secondary | ICD-10-CM

## 2013-02-11 DIAGNOSIS — Z0000 Encounter for general adult medical examination without abnormal findings: Secondary | ICD-10-CM

## 2013-02-11 DIAGNOSIS — R5383 Other fatigue: Secondary | ICD-10-CM

## 2013-02-11 LAB — HEMOGLOBIN A1C
Hgb A1c MFr Bld: 6.1 % — ABNORMAL HIGH (ref <5.7)
Mean Plasma Glucose: 128 mg/dL — ABNORMAL HIGH (ref <117)

## 2013-02-11 LAB — HEPATIC FUNCTION PANEL
ALT: 12 U/L (ref 0–35)
Bilirubin, Direct: 0.1 mg/dL (ref 0.0–0.3)
Indirect Bilirubin: 0.5 mg/dL (ref 0.0–0.9)

## 2013-02-11 LAB — LIPID PANEL
Cholesterol: 138 mg/dL (ref 0–200)
HDL: 55 mg/dL (ref >39)
Total CHOL/HDL Ratio: 2.5 Ratio

## 2013-02-11 MED ORDER — SUMATRIPTAN SUCCINATE 50 MG PO TABS: 50.0000 mg | ORAL_TABLET | ORAL | Status: AC | PRN

## 2013-02-11 MED ORDER — LINACLOTIDE 145 MCG PO CAPS: 145.0000 ug | ORAL_CAPSULE | Freq: Every day | ORAL | Status: AC

## 2013-02-11 NOTE — Assessment & Plan Note (Signed)
Patient asymptomatic.

## 2013-02-11 NOTE — Assessment & Plan Note (Signed)
Encourage daily fiber supplementation and probiotic.  Continue medications prn.

## 2013-02-11 NOTE — Assessment & Plan Note (Signed)
Rare symptoms.  Well-controlled with Imitrex abortive therapy.

## 2013-02-11 NOTE — Patient Instructions (Addendum)
Please obtain fasting labs.  I will call you with your results.  I am glad that your fatigue is resolving.  I believe this is associated with your menstrual cycles.  Can resume iron supplementation with vitamin c, fiber supplement and probiotic as tolerated.  I will set up a referral for you to see ENT.  We will schedule follow-up depending on your lab results.  I would like to see you in 3 months to see how you are doing with energy.  Please get your flu shot at the health [facility].  Iron Deficiency Anemia There are many types of anemia. Iron deficiency anemia is the most common. Iron deficiency anemia is a decrease in the number of red blood cells caused by too little iron. Without enough iron, your body does not produce enough hemoglobin. Hemoglobin is a substance in red blood cells that carries oxygen to the body's tissues. Iron deficiency anemia may leave you tired and short of breath. CAUSES   Lack of iron in the diet.  This may be seen in infants and children, because there is little iron in milk.  This may be seen in adults who do not eat enough iron-rich foods.  This may be seen in pregnant or breastfeeding women who do not take iron supplements. There is a much higher need for iron intake at these times.  Poor absorption of iron, as seen with intestinal disorders.  Intestinal bleeding.  Heavy periods. SYMPTOMS  Mild anemia may not be noticeable. Symptoms may include:  Fatigue.  Headache.  Pale skin.  Weakness.  Shortness of breath.  Dizziness.  Cold hands and feet.  Fast or irregular heartbeat. DIAGNOSIS  Diagnosis requires a thorough evaluation and physical exam by your caregiver.  Blood tests are generally used to confirm iron deficiency anemia.  Additional tests may be done to find the underlying cause of your anemia. These may include:  Testing for blood in the stool (fecal occult blood test).  A procedure to see inside the colon and rectum (colonoscopy).  A  procedure to see inside the esophagus and stomach (endoscopy). TREATMENT   Correcting the cause of the iron deficiency is the first step.  Medicines, such as oral contraceptives, can make heavy menstrual flows lighter.  Antibiotics and other medicines can be used to treat peptic ulcers.  Surgery may be needed to remove a bleeding polyp, tumor, or fibroid.  Often, iron supplements (ferrous sulfate) are taken.  For the best iron absorption, take these supplements with an empty stomach.  You may need to take the supplements with food if you cannot tolerate them on an empty stomach. Vitamin C improves the absorption of iron. Your caregiver may recommend taking your iron tablets with a glass of orange juice or vitamin C supplement.  Milk and antacids should not be taken at the same time as iron supplements. They may interfere with the absorption of iron.  Iron supplements can cause constipation. A stool softener is often recommended.  Pregnant and breastfeeding women will need to take extra iron, because their normal diet usually will not provide the required amount.  Patients who cannot tolerate iron by mouth can take it through a vein (intravenously) or by an injection into the muscle. HOME CARE INSTRUCTIONS   Ask your dietitian for help with diet questions.  Take iron and vitamins as directed by your caregiver.  Eat a diet rich in iron. Eat liver, lean beef, whole-grain bread, eggs, dried fruit, and dark green leafy vegetables. SEEK  IMMEDIATE MEDICAL CARE IF:   You have a fainting episode. Do not drive yourself. Call your local emergency services (911 in [place] if no other help is available.  You have chest pain, nausea, or vomiting.  You develop severe or increased shortness of breath with activities.  You develop weakness or increased thirst.  You have a rapid heartbeat.  You develop unexplained sweating or become lightheaded when getting up from a chair or bed. MAKE SURE  YOU:   Understand these instructions.  Will watch your condition.  Will get help right away if you are not doing well or get worse. Document Released: 05/23/2000 Document Revised: 08/18/2011 Document Reviewed: 10/02/2009 [facility] Patient Information 2014 [place], [place].

## 2013-02-11 NOTE — Progress Notes (Signed)
Patient ID: Natasha Hall, female   DOB: Aug 20, 1975, 37 y.o.   MRN: [id number]  Patient presents to clinic today for complete physical examination.  Information was obtained from the patient.    Acute Concerns: (1) Follow-up for Fatigue -- patient states fatigue is much improved since recent visit.  Discussed her history of IDA and use of iron supplementation with fiber and probiotic.  Symptoms seem to center around patient's menstrual periods.  Patient asymptomatic at present.  (2) Patient wishes to have her A1C checked.  Endorses extensive family history of DM.  Has never been formally diagnosed herself, but is aware of her risk factors.  Patient denies urinary frequency, polyuria, polydipsia, polyphagia, tingling or numbness of feet.  No history of poor wound healing.  Chronic Issues: (1) Chronic Migraines -- patient states last headache was a month ago and was centered around her menstrual period.  Otherwise she has had no other headache in several months.  Imitrex is effective for migraine abortion.  (2) GERD -- asymptomatic.  Patient avoids trigger foods.  (3) Goiter -- patient states goiter has enlarged.  TFTs performed at visit this past week and were within normal limits.  Examination at that time revealed no nodules, only uniform enlargement.  Patient has not been to ENT for a year.  Would like ENT referral within [facility] system.  (4) Allergic Rhinitis -- seasonal symptoms controlled well with daily zyrtec.  (5) Constipation -- takes Linzess occasionally to help with regulation of bowels.  Denies abdominal pain, N/V, hematochezia or melena  Health Maintenance: Eye -- annual exam Dental -- at least once per year Immunizations -- Up-to-date.  Declines influenza vaccine PAP -- last pap 1 year ago.  Records indicated CIN I, patient did not follow-up.  Recheck PAP today.  Possible OBGYN referral depending on results.  Past Medical History  Diagnosis Date  . Allergy     seasonal  allergic rhinitis  . GERD (gastroesophageal reflux disease)   . Anemia     past history  . History of chicken pox   . Bursitis     bilateral hip R>L- sees ortho (uses tramadol & ibuprofen)  . Constipation   . Chronic headaches   . Thyroid disease   . Pre-diabetes     Current Outpatient Prescriptions on File Prior to Visit  Medication Sig Dispense Refill  . BIOTIN PO Take 2 each by mouth daily.      . cetirizine (ZYRTEC) 10 MG tablet Take 10 mg by mouth daily as needed.      . docusate sodium (COLACE) 100 MG capsule Take 100 mg by mouth daily as needed.        . ferrous sulfate 325 (65 FE) MG tablet Take 1 tablet (325 mg total) by mouth daily.  90 tablet  3  . ibuprofen (ADVIL,MOTRIN) 200 MG tablet Take 200 mg by mouth as needed.        . Multiple Vitamins-Calcium (ONE-A-DAY WOMENS FORMULA) TABS Take 1 tablet by mouth daily.      . norgestimate-ethinyl estradiol (ORTHO-CYCLEN,SPRINTEC,PREVIFEM) 0.25-35 MG-MCG tablet Take 1 tablet by mouth daily. As Needed for Hormone Imbalance to Regulate Menstrual Cycles.  1 Package  1  . traMADol (ULTRAM) 50 MG tablet Take 1 tablet (50 mg total) by mouth every 6 (six) hours as needed.  15 tablet  0   No current facility-administered medications on file prior to visit.    No Known Allergies  Family History  Problem Relation Age of  Onset  . Diabetes Father   . Coronary artery disease Father   . Colon polyps Father   . Heart failure Father   . Hypertension Father   . Hyperlipidemia Father   . Heart attack Father   . Ovarian cancer Maternal Grandmother   . Colon polyps Maternal Grandfather   . Colon cancer Neg Hx   . Sudden death Neg Hx     History   Social History  . Marital Status: Single    Spouse Name: N/A    Number of Children: 0  . Years of Education: N/A   Occupational History  . Admission Service Associate     [employer]   Social History Main Topics  . Smoking status: Never Smoker   . Smokeless tobacco: Never Used  .  Alcohol Use: No  . Drug Use: No  . Sexual Activity: None   Other Topics Concern  . None   Social History Narrative   Lives with god sister   Daily caffeine    Review of Systems  Constitutional: Negative for fever, chills, weight loss and malaise/fatigue.  Eyes: Negative for blurred vision, double vision and photophobia.  Respiratory: Negative for cough, shortness of breath and wheezing.   Cardiovascular: Negative for chest pain and palpitations.  Gastrointestinal: Negative for heartburn, nausea, vomiting, abdominal pain, diarrhea and constipation.  Genitourinary: Negative for dysuria, urgency, frequency, hematuria and flank pain.  Musculoskeletal: Negative for myalgias.  Skin: Negative for rash.  Neurological: Positive for headaches. Negative for dizziness and loss of consciousness.  Endo/Heme/Allergies: Positive for environmental allergies. Does not bruise/bleed easily.  Psychiatric/Behavioral: Negative for depression and substance abuse. The patient does not have insomnia.    Filed Vitals:   02/11/13 0850  BP: 104/78  Pulse: 80  Temp: 98 F (36.7 C)  Resp: 16   Physical Exam  Vitals reviewed. Constitutional: She is oriented to person, place, and time and well-developed, well-nourished, and in no distress.  HENT:  Head: Normocephalic and atraumatic.  Right Ear: External ear normal.  Left Ear: External ear normal.  Nose: Nose normal.  Mouth/Throat: Oropharynx is clear and moist. No oropharyngeal exudate.  TM WNL bilaterally  Eyes: Conjunctivae and EOM are normal. Pupils are equal, round, and reactive to light.  Neck: Normal range of motion. Neck supple.  Diffuse enlarged goiter, unchanged from previous examination  Cardiovascular: Normal rate, regular rhythm, normal heart sounds and intact distal pulses.   Pulmonary/Chest: Effort normal and breath sounds normal. Right breast exhibits no inverted nipple, no mass, no nipple discharge, no skin change and no tenderness. Left  breast exhibits no inverted nipple, no mass, no nipple discharge, no skin change and no tenderness. Breasts are symmetrical.  Abdominal: Soft. Bowel sounds are normal. She exhibits no distension and no mass. There is no tenderness. There is no rebound and no guarding.  Genitourinary:  External genitalia WNL.  Speculum exam performed -- vaginal mucosa WNL.  PAP performed.  No cervical discharge of lesion.  No CMT, no adnexal tenderness, no mass or abnormality noted.  Musculoskeletal: Normal range of motion.  Lymphadenopathy:    She has no cervical adenopathy.  Neurological: She is alert and oriented to person, place, and time. She has normal reflexes. No cranial nerve deficit.  Skin: Skin is warm and dry. No rash noted.     Recent Results (from the past 2160 hour(s))  CBC WITH DIFFERENTIAL     Status: Abnormal   Collection Time    02/09/13  9:42 AM  Result Value Range   WBC 5.4  4.0 - 10.5 K/uL   RBC 4.08  3.87 - 5.11 MIL/uL   Hemoglobin 10.9 (*) 12.0 - 15.0 g/dL   HCT 16.1 (*) 09.6 - 04.5 %   MCV 79.4  78.0 - 100.0 fL   MCH 26.7  26.0 - 34.0 pg   MCHC 33.6  30.0 - 36.0 g/dL   RDW 40.9  81.1 - 91.4 %   Platelets 348  150 - 400 K/uL   Neutrophils Relative % 60  43 - 77 %   Neutro Abs 3.2  1.7 - 7.7 K/uL   Lymphocytes Relative 30  12 - 46 %   Lymphs Abs 1.7  0.7 - 4.0 K/uL   Monocytes Relative 7  3 - 12 %   Monocytes Absolute 0.4  0.1 - 1.0 K/uL   Eosinophils Relative 3  0 - 5 %   Eosinophils Absolute 0.2  0.0 - 0.7 K/uL   Basophils Relative 0  0 - 1 %   Basophils Absolute 0.0  0.0 - 0.1 K/uL   Smear Review Criteria for review not met    FERRITIN     Status: None   Collection Time    02/09/13  9:42 AM      Result Value Range   Ferritin 13  10 - 291 ng/mL  TSH     Status: None   Collection Time    02/09/13  9:42 AM      Result Value Range   TSH 1.004  0.350 - 4.500 uIU/mL  T4, FREE     Status: None   Collection Time    02/09/13  9:42 AM      Result Value Range    Free T4 0.97  0.80 - 1.80 ng/dL  URINALYSIS, ROUTINE W REFLEX MICROSCOPIC     Status: None   Collection Time    02/09/13  9:42 AM      Result Value Range   Color, Urine YELLOW  YELLOW   APPearance CLEAR  CLEAR   Specific Gravity, Urine 1.014  1.005 - 1.030   pH 6.0  5.0 - 8.0   Glucose, UA NEG  NEG mg/dL   Bilirubin Urine NEG  NEG   Ketones, ur NEG  NEG mg/dL   Hgb urine dipstick NEG  NEG   Protein, ur NEG  NEG mg/dL   Urobilinogen, UA 0.2  0.0 - 1.0 mg/dL   Nitrite NEG  NEG   Leukocytes, UA NEG  NEG  BASIC METABOLIC PANEL     Status: None   Collection Time    02/09/13  9:42 AM      Result Value Range   Sodium 136  135 - 145 mEq/L   Potassium 4.4  3.5 - 5.3 mEq/L   Chloride 101  96 - 112 mEq/L   CO2 28  19 - 32 mEq/L   Glucose, Bld 94  70 - 99 mg/dL   BUN 6  6 - 23 mg/dL   Creat 7.82  9.56 - 2.13 mg/dL   Calcium 9.7  8.4 - 08.6 mg/dL   Assessment/Plan: ALLERGIC RHINITIS, SEASONAL Continue zyrtec a needed during seasonal flare-ups  GERD Patient asymptomatic.  CONSTIPATION, INTERMITTENT Encourage daily fiber supplementation and probiotic.  Continue medications prn.  Thyroid enlargement Unchanged from previous examination.  TFT's within normal limits.  Referral to ENT.  Screening for cervical cancer GU exam WNL.  PAP sent.  HEADACHE, CHRONIC Rare symptoms.  Well-controlled with Imitrex abortive  therapy.  Fatigue Resolving.  Patient to follow plan discussed at last visit.  Can restart iron supplementation as tolerated.  Follow-up in 2-3 months.  Annual physical exam Obtain fasting labs.  Patient up-to-date on required immunizations.  Discussed mammography.  Patient declines annual influenza vaccination at visit.

## 2013-02-11 NOTE — Assessment & Plan Note (Signed)
GU exam WNL.  PAP sent.

## 2013-02-11 NOTE — Assessment & Plan Note (Signed)
Unchanged from previous examination.  TFT's within normal limits.  Referral to ENT.

## 2013-02-11 NOTE — Assessment & Plan Note (Signed)
Continue zyrtec a needed during seasonal flare-ups

## 2013-02-11 NOTE — Assessment & Plan Note (Signed)
Obtain fasting labs.  Patient up-to-date on required immunizations.  Discussed mammography.  Patient declines annual influenza vaccination at visit.

## 2013-02-11 NOTE — Assessment & Plan Note (Signed)
Resolving.  Patient to follow plan discussed at last visit.  Can restart iron supplementation as tolerated.  Follow-up in 2-3 months.

## 2013-04-14 ENCOUNTER — Other Ambulatory Visit: Payer: Self-pay

## 2013-05-20 ENCOUNTER — Ambulatory Visit (INDEPENDENT_AMBULATORY_CARE_PROVIDER_SITE_OTHER): Payer: 59 | Admitting: Physician Assistant

## 2013-05-20 ENCOUNTER — Encounter: Payer: Self-pay | Admitting: Physician Assistant

## 2013-05-20 VITALS — BP 126/82 | HR 84 | Temp 98.7°F | Wt 257.2 lb

## 2013-05-20 DIAGNOSIS — R5383 Other fatigue: Secondary | ICD-10-CM

## 2013-05-20 DIAGNOSIS — Z8639 Personal history of other endocrine, nutritional and metabolic disease: Secondary | ICD-10-CM

## 2013-05-20 DIAGNOSIS — K5900 Constipation, unspecified: Secondary | ICD-10-CM

## 2013-05-20 DIAGNOSIS — R5381 Other malaise: Secondary | ICD-10-CM

## 2013-05-20 DIAGNOSIS — Z862 Personal history of diseases of the blood and blood-forming organs and certain disorders involving the immune mechanism: Secondary | ICD-10-CM

## 2013-05-20 NOTE — Progress Notes (Signed)
Pre-visit discussion using our clinic review tool. No additional management support is needed unless otherwise documented below in the visit note.  

## 2013-05-20 NOTE — Assessment & Plan Note (Signed)
Resolved

## 2013-05-20 NOTE — Assessment & Plan Note (Signed)
Patient has not been taking fiber supplement or probiotic. Encourage patient to start these measures. Also increase fluid intake. Will obtain ultrasound of abdomen and pelvis if symptoms persist.

## 2013-05-20 NOTE — Patient Instructions (Signed)
Increase fluid intake.  Take a fiber supplement and a daily probiotic (like Align, that has bifidobacteria infantis) in it.  If symptoms do not begin to improve, give Natasha Hall a call and we will schedule an ultrasound.  I want to see you in 3 months for repeat PAP smear.  Also, you will be contacted by an ENT office.

## 2013-05-20 NOTE — Progress Notes (Signed)
Patient ID: Natasha Hall, female   DOB: 12-15-1975, 37 y.o.   MRN: [id number]  Patient presents to clinic today for three-month followup of fatigue. Patient has history of iron deficiency anemia.  Patient states she has been doing very well. On the has not fatigue around her menstrual cycle. Is not taking iron supplementation. Has been watching her diet and has joined a running group. Is running in a 5K tomorrow.  States she feels pretty good overall.  Patient does endorse occasional right lower quadrant pain. States she has a history of this and has had a workup in the past that was negative. 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 Patient currently taking Linzess.  Patient denies nausea, vomiting, fever. Denies GU symptoms.  Denies history of abdominal surgery.  Past Medical History  Diagnosis Date  . Allergy     seasonal allergic rhinitis  . GERD (gastroesophageal reflux disease)   . Anemia     past history  . History of chicken pox   . Bursitis     bilateral hip R>L- sees ortho (uses tramadol & ibuprofen)  . Constipation   . Chronic headaches   . Thyroid disease   . Pre-diabetes     Current Outpatient Prescriptions on File Prior to Visit  Medication Sig Dispense Refill  . BIOTIN PO Take 2 each by mouth daily.      . cetirizine (ZYRTEC) 10 MG tablet Take 10 mg by mouth daily as needed.      Marland Kitchen ibuprofen (ADVIL,MOTRIN) 200 MG tablet Take 200 mg by mouth as needed.        . Linaclotide (LINZESS) 145 MCG CAPS capsule Take 1 capsule (145 mcg total) by mouth daily.  30 capsule  3  . Multiple Vitamins-Calcium (ONE-A-DAY WOMENS FORMULA) TABS Take 1 tablet by mouth daily.      . norgestimate-ethinyl estradiol (ORTHO-CYCLEN,SPRINTEC,PREVIFEM) 0.25-35 MG-MCG tablet Take 1 tablet by mouth daily. As Needed for Hormone Imbalance to Regulate Menstrual Cycles.  1 Package  1  . SUMAtriptan (IMITREX) 50 MG tablet Take 1 tablet (50 mg total) by mouth every 2 (two) hours as needed for migraine.  10  tablet  3  . traMADol (ULTRAM) 50 MG tablet Take 1 tablet (50 mg total) by mouth every 6 (six) hours as needed.  15 tablet  0   No current facility-administered medications on file prior to visit.    Allergies  Allergen Reactions  . Onion     Family History  Problem Relation Age of Onset  . Diabetes Father   . Coronary artery disease Father   . Colon polyps Father   . Heart failure Father   . Hypertension Father   . Hyperlipidemia Father   . Heart attack Father   . Ovarian cancer Maternal Grandmother   . Colon polyps Maternal Grandfather   . Colon cancer Neg Hx   . Sudden death Neg Hx     History   Social History  . Marital Status: Single    Spouse Name: N/A    Number of Children: 0  . Years of Education: N/A   Occupational History  . Admission Service Associate     [employer]   Social History Main Topics  . Smoking status: Never Smoker   . Smokeless tobacco: Never Used  . Alcohol Use: No  . Drug Use: No  . Sexual Activity: None   Other Topics Concern  . None   Social History Narrative   Lives with god  sister   Daily caffeine    Review of Systems -see history of present illness. All other review of systems negative.   Filed Vitals:   05/20/13 0817  BP: 126/82  Pulse: 84  Temp: 98.7 F (37.1 C)   Physical Exam  Constitutional: She is oriented to person, place, and time.  Well-developed, obese African American female in no acute distress.  HENT:  Head: Normocephalic and atraumatic.  Mouth/Throat: Oropharynx is clear and moist.  Eyes: Conjunctivae and EOM are normal. Pupils are equal, round, and reactive to light.  Neck: Neck supple.  Cardiovascular: Normal rate, regular rhythm, normal heart sounds and intact distal pulses.   Pulmonary/Chest: Effort normal and breath sounds normal. No respiratory distress. She has no wheezes. She has no rales. She exhibits no tenderness.  Abdominal: Soft. Bowel sounds are normal. She exhibits no distension and no  mass. There is no rebound and no guarding.  Mild tenderness to deep palpation in right lower quadrant.  Neurological: She is alert and oriented to person, place, and time.  Skin: Skin is warm and dry. No rash noted.  Psychiatric: Affect normal.   Assessment/Plan: CONSTIPATION, INTERMITTENT Patient has not been taking fiber supplement or probiotic. Encourage patient to start these measures. Also increase fluid intake. Will obtain ultrasound of abdomen and pelvis if symptoms persist.  Fatigue Resolved.

## 2013-06-15 ENCOUNTER — Encounter: Payer: Self-pay | Admitting: Physician Assistant

## 2013-06-15 ENCOUNTER — Ambulatory Visit (HOSPITAL_BASED_OUTPATIENT_CLINIC_OR_DEPARTMENT_OTHER)
Admission: RE | Admit: 2013-06-15 | Discharge: 2013-06-15 | Disposition: A | Discharge status: Home / Self Care | Payer: 59 | Source: Ambulatory Visit | Attending: Physician Assistant | Admitting: Physician Assistant

## 2013-06-15 ENCOUNTER — Ambulatory Visit (INDEPENDENT_AMBULATORY_CARE_PROVIDER_SITE_OTHER): Payer: 59 | Admitting: Physician Assistant

## 2013-06-15 VITALS — BP 110/76 | HR 81 | Temp 98.5°F | Resp 16 | Ht 69.0 in | Wt 262.2 lb

## 2013-06-15 DIAGNOSIS — J309 Allergic rhinitis, unspecified: Secondary | ICD-10-CM

## 2013-06-15 DIAGNOSIS — E049 Nontoxic goiter, unspecified: Secondary | ICD-10-CM | POA: Insufficient documentation

## 2013-06-15 LAB — CBC WITH DIFFERENTIAL/PLATELET
BASOS ABS: 0 10*3/uL (ref 0.0–0.1)
BASOS PCT: 0 % (ref 0–1)
Eosinophils Absolute: 0.2 10*3/uL (ref 0.0–0.7)
Eosinophils Relative: 4 % (ref 0–5)
HCT: 30.9 % — ABNORMAL LOW (ref 36.0–46.0)
HEMOGLOBIN: 10.3 g/dL — AB (ref 12.0–15.0)
Lymphocytes Relative: 32 % (ref 12–46)
Lymphs Abs: 1.6 10*3/uL (ref 0.7–4.0)
MCH: 26.1 pg (ref 26.0–34.0)
MCHC: 33.3 g/dL (ref 30.0–36.0)
MCV: 78.4 fL (ref 78.0–100.0)
MONOS PCT: 8 % (ref 3–12)
Monocytes Absolute: 0.4 10*3/uL (ref 0.1–1.0)
NEUTROS ABS: 2.8 10*3/uL (ref 1.7–7.7)
NEUTROS PCT: 56 % (ref 43–77)
PLATELETS: 349 10*3/uL (ref 150–400)
RBC: 3.94 MIL/uL (ref 3.87–5.11)
RDW: 15.3 % (ref 11.5–15.5)
WBC: 5 10*3/uL (ref 4.0–10.5)

## 2013-06-15 LAB — T4, FREE: FREE T4: 1.02 ng/dL (ref 0.80–1.80)

## 2013-06-15 LAB — TSH: TSH: 1.013 u[IU]/mL (ref 0.350–4.500)

## 2013-06-15 MED ORDER — FLUTICASONE PROPIONATE 50 MCG/ACT NA SUSP: 2.0000 | Freq: Every day | NASAL | Status: AC

## 2013-06-15 NOTE — Progress Notes (Signed)
Patient ID: Natasha Hall, female   DOB: 25-Nov-1975, 38 y.o.   MRN: [id number]  Patient presents to clinic today complaining of one week of bilateral ear ache and pressure. Patient states she feels like she would be okay if she could just "pop" her ears. Denies fever, chills, malaise or fatigue. Denies cough, nasal congestion, sinus pressure or sinus pain. Denies tooth pain. Denies recent travel or sick contact. Denies decrease in hearing, tinnitus, ear drainage.  Patientnt does endorse significant seasonal allergies for which she takes Zyrtec PRN. Patient has history of multinodular goiter. Patient states the past week she has noticed some tenderness of her thyroid, more pronounced on the right side. States her goiter seems "hard".  Patient is seeing ENT specialist this Friday. Denies chest pain, palpitations, agitation.  Past Medical History  Diagnosis Date  . Allergy     seasonal allergic rhinitis  . GERD (gastroesophageal reflux disease)   . Anemia     past history  . History of chicken pox   . Bursitis     bilateral hip R>L- sees ortho (uses tramadol & ibuprofen)  . Constipation   . Chronic headaches   . Thyroid disease   . Pre-diabetes     Current Outpatient Prescriptions on File Prior to Visit  Medication Sig Dispense Refill  . BIOTIN PO Take 2 each by mouth daily.      . cetirizine (ZYRTEC) 10 MG tablet Take 10 mg by mouth daily as needed.      Marland Kitchen ibuprofen (ADVIL,MOTRIN) 200 MG tablet Take 200 mg by mouth as needed.        . Linaclotide (LINZESS) 145 MCG CAPS capsule Take 1 capsule (145 mcg total) by mouth daily.  30 capsule  3  . Multiple Vitamins-Calcium (ONE-A-DAY WOMENS FORMULA) TABS Take 1 tablet by mouth daily.      . norgestimate-ethinyl estradiol (ORTHO-CYCLEN,SPRINTEC,PREVIFEM) 0.25-35 MG-MCG tablet Take 1 tablet by mouth daily. As Needed for Hormone Imbalance to Regulate Menstrual Cycles.  1 Package  1  . SUMAtriptan (IMITREX) 50 MG tablet Take 1 tablet (50 mg total) by  mouth every 2 (two) hours as needed for migraine.  10 tablet  3  . traMADol (ULTRAM) 50 MG tablet Take 1 tablet (50 mg total) by mouth every 6 (six) hours as needed.  15 tablet  0   No current facility-administered medications on file prior to visit.    Allergies  Allergen Reactions  . Onion     Family History  Problem Relation Age of Onset  . Diabetes Father   . Coronary artery disease Father   . Colon polyps Father   . Heart failure Father   . Hypertension Father   . Hyperlipidemia Father   . Heart attack Father   . Ovarian cancer Maternal Grandmother   . Colon polyps Maternal Grandfather   . Colon cancer Neg Hx   . Sudden death Neg Hx     History   Social History  . Marital Status: Single    Spouse Name: N/A    Number of Children: 0  . Years of Education: N/A   Occupational History  . Admission Service Associate     [employer]   Social History Main Topics  . Smoking status: Never Smoker   . Smokeless tobacco: Never Used  . Alcohol Use: No  . Drug Use: No  . Sexual Activity: None   Other Topics Concern  . None   Social History Narrative   Lives with  god sister   Daily caffeine    Review of Systems - See HPI.  All other ROS are negative.  Filed Vitals:   06/15/13 0821  BP: 110/76  Pulse: 81  Temp: 98.5 F (36.9 C)  Resp: 16   Physical Exam  Constitutional: She is oriented to person, place, and time and well-developed, well-nourished, and in no distress.  HENT:  Head: Normocephalic and atraumatic.  Right Ear: Tympanic membrane, external ear and ear canal normal. No drainage or tenderness. Tympanic membrane is not injected, not perforated and not retracted. No decreased hearing is noted.  Left Ear: Tympanic membrane, external ear and ear canal normal. No drainage or tenderness. Tympanic membrane is not injected, not perforated and not retracted. No decreased hearing is noted.  Nose: Nose normal.  Mouth/Throat: Oropharynx is clear and moist. No  oropharyngeal exudate.  Eyes: Conjunctivae are normal. Pupils are equal, round, and reactive to light.  Neck: Mass and thyromegaly present.  Cardiovascular: Normal rate, regular rhythm, normal heart sounds and intact distal pulses.   Pulmonary/Chest: Effort normal and breath sounds normal. No respiratory distress. She has no wheezes. She has no rales. She exhibits no tenderness.  Neurological: She is alert and oriented to person, place, and time.  Skin: Skin is warm and dry. No rash noted.  Psychiatric: Affect normal.    No results Hall for this or any previous visit (from the past 2160 hour(s)).  Assessment/Plan: No problem-specific assessment & plan notes Hall for this encounter.

## 2013-06-15 NOTE — Assessment & Plan Note (Signed)
Zyrtec daily.  Rx Flonase daily.

## 2013-06-15 NOTE — Assessment & Plan Note (Signed)
Palpable mass on examination.  Patient with history of non-toxic goiter.  Will obtain CBC, TSH, Free T4 and Thyroid US.  Patient is scheduled with ENT this Friday.

## 2013-06-15 NOTE — Patient Instructions (Signed)
Please obtain labs.  I will call you with your results.  Please stop by front desk after lab to speak with [facility] about scheduling ultrasound.  Use flonase as directed.  Continue Zyrtec daily.  Follow-up with ENT on Friday.

## 2013-06-15 NOTE — Progress Notes (Signed)
Pre visit review using our clinic review tool, if applicable. No additional management support is needed unless otherwise documented below in the visit note/SLS  

## 2013-06-16 ENCOUNTER — Telehealth: Payer: Self-pay | Admitting: *Deleted

## 2013-06-16 NOTE — Telephone Encounter (Signed)
Study Result    CLINICAL DATA: Followup right side thyroid nodule, goiter, large  probable nodule  EXAM:  THYROID ULTRASOUND  TECHNIQUE:  Ultrasound examination of the thyroid gland and adjacent soft  tissues was performed.  COMPARISON: 06/25/2011  FINDINGS:  Right thyroid lobe  Measurements: 9.3 x 4.5 x 5.4 cm. Dominant mass right lobe at mid  portion, heterogeneous isoechoic to hypoechoic, 6.3 x 4.2 x 5.1 cm,  previously 5.3 x 3.5 x 4.3 cm. Additional small nodule at upper pole  9 x 7 x 8 mm previously 8 x 8 x 7 mm.  Left thyroid lobe  Measurements: 6.9 x 2.4 x 2.0 cm. Normal echogenicity without mass  or calcification.  Isthmus  Thickness: 4 mm thick. No nodules visualized.  Lymphadenopathy  None visualized.  IMPRESSION:  Dominant mass mid right thyroid lobe 6.3 x 4.2 x 5.1 cm, increased  in size since previous exam.  Findings meet consensus criteria for biopsy. Unless previously  biopsied, ultrasound-guided fine needle aspiration should be  considered, as per the consensus statement: Management of Thyroid  Nodules Detected at [place]: Society of Radiologists in [place]. Radiology 2005; [id number].    PROVIDER INFORMED OF CALL REPORT/SLS

## 2013-06-17 ENCOUNTER — Encounter (HOSPITAL_COMMUNITY): Payer: Self-pay | Admitting: Pharmacy Technician

## 2013-06-23 ENCOUNTER — Other Ambulatory Visit: Payer: Self-pay | Admitting: Otolaryngology

## 2013-06-23 ENCOUNTER — Encounter (HOSPITAL_COMMUNITY): Admission: RE | Payer: Self-pay | Source: Ambulatory Visit

## 2013-06-23 ENCOUNTER — Ambulatory Visit (HOSPITAL_COMMUNITY): Admission: RE | Admit: 2013-06-23 | Payer: 59 | Source: Ambulatory Visit | Admitting: Otolaryngology

## 2013-06-23 DIAGNOSIS — E041 Nontoxic single thyroid nodule: Secondary | ICD-10-CM

## 2013-06-23 DIAGNOSIS — E042 Nontoxic multinodular goiter: Secondary | ICD-10-CM

## 2013-06-23 SURGERY — THYROIDECTOMY
Anesthesia: General | Laterality: Right

## 2013-06-29 ENCOUNTER — Ambulatory Visit
Admission: RE | Admit: 2013-06-29 | Discharge: 2013-06-29 | Disposition: A | Discharge status: Home / Self Care | Payer: 59 | Source: Ambulatory Visit | Attending: Otolaryngology | Admitting: Otolaryngology

## 2013-06-29 ENCOUNTER — Other Ambulatory Visit (HOSPITAL_COMMUNITY)
Admission: RE | Admit: 2013-06-29 | Discharge: 2013-06-29 | Disposition: A | Discharge status: Home / Self Care | Payer: 59 | Source: Ambulatory Visit | Attending: Interventional Radiology | Admitting: Interventional Radiology

## 2013-06-29 DIAGNOSIS — E041 Nontoxic single thyroid nodule: Secondary | ICD-10-CM | POA: Insufficient documentation

## 2013-07-14 IMAGING — CT CT NECK W/ CM
3 of 4 series · 13 of 33 positions shown, 16 images · IV contrast (omnipaque)
Comparison: 09/07/2009

CLINICAL DATA: Right neck swelling and tenderness.

CT NECK WITH CONTRAST
TECHNIQUE: Multidetector CT imaging of the neck was performed with
intravenous contrast.
Contrast: 75mL OMNIPAQUE IOHEXOL 300 MG/ML IV SOLN

[Series 2: neck 2.0 b31s · axial · 0.39mm/px · z∈[-223,-63]mm · 5 of 120 slices shown, 7 images]
[im 20/120  soft-tissue]
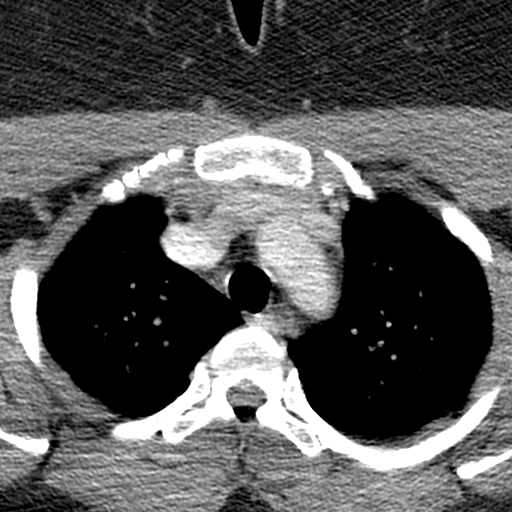
[im 20/120  bone]
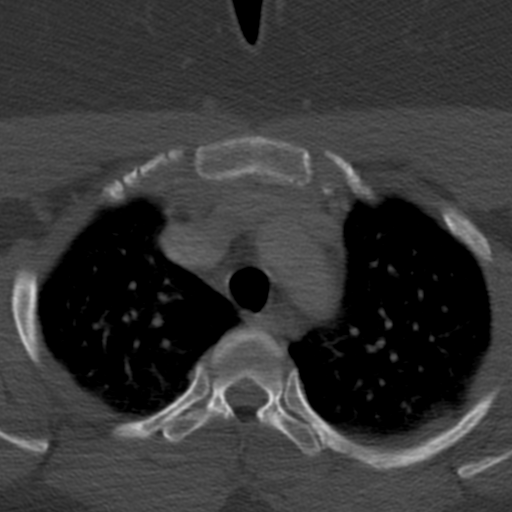
[im 40/120  bone]
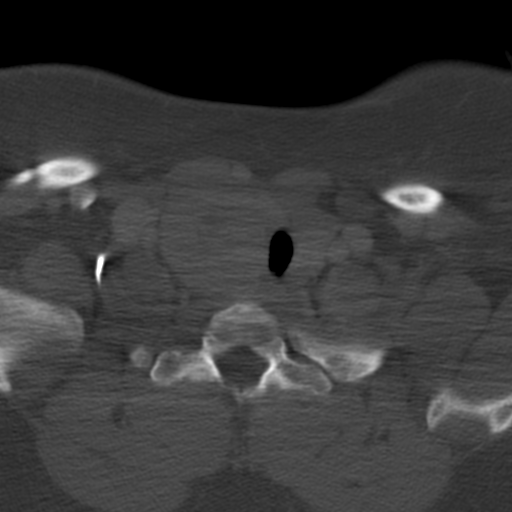
[im 60/120  bone]
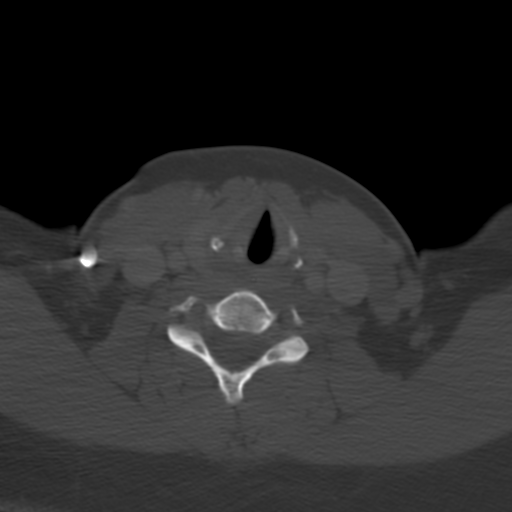
[im 80/120  bone]
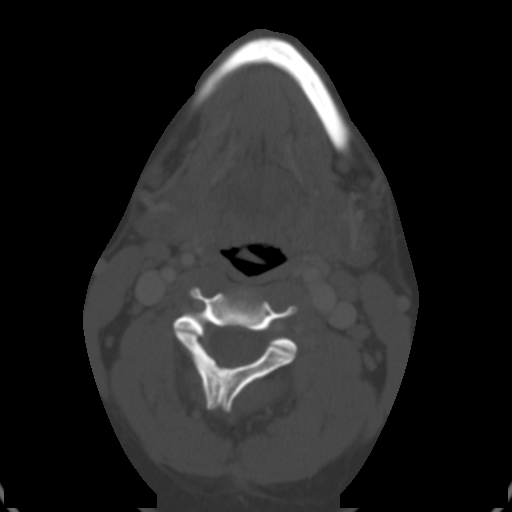
[im 100/120  soft-tissue]
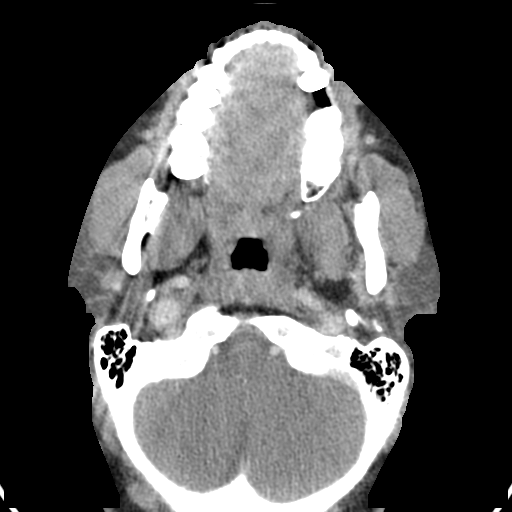
[im 100/120  bone]
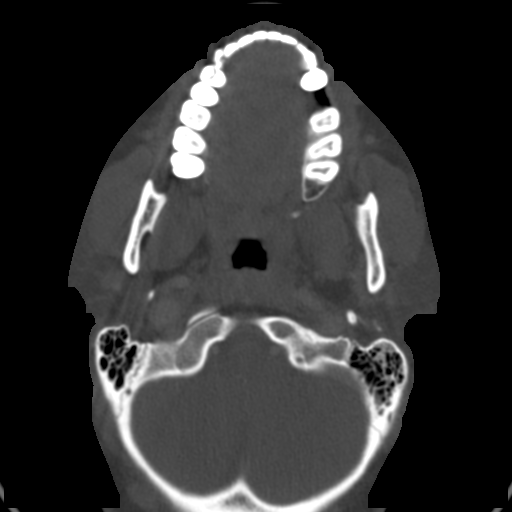

[Series 5: neck 2.0 sagittal · sagittal · 0.39mm/px · 5 of 98 slices shown, 6 images]
[im 33/98  bone]
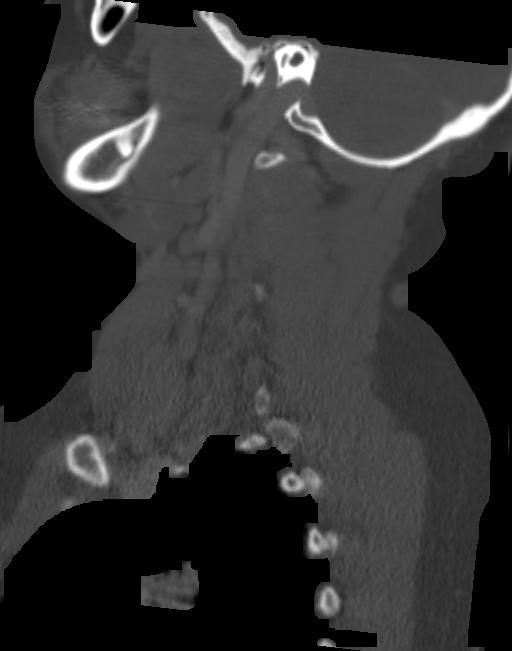
[im 41/98  bone]
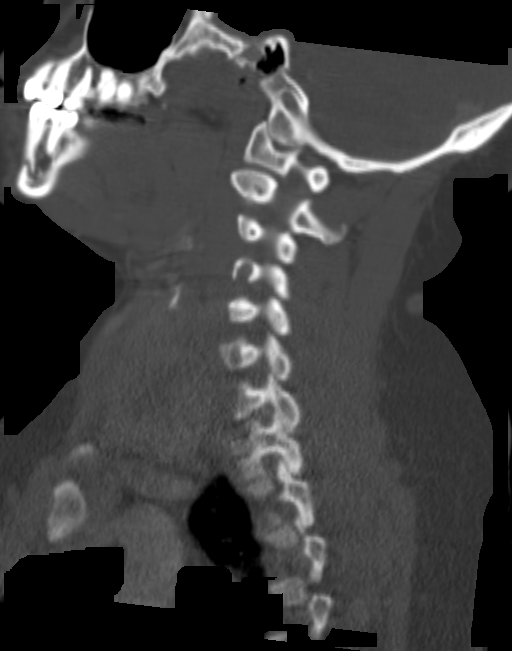
[im 49/98  soft-tissue]
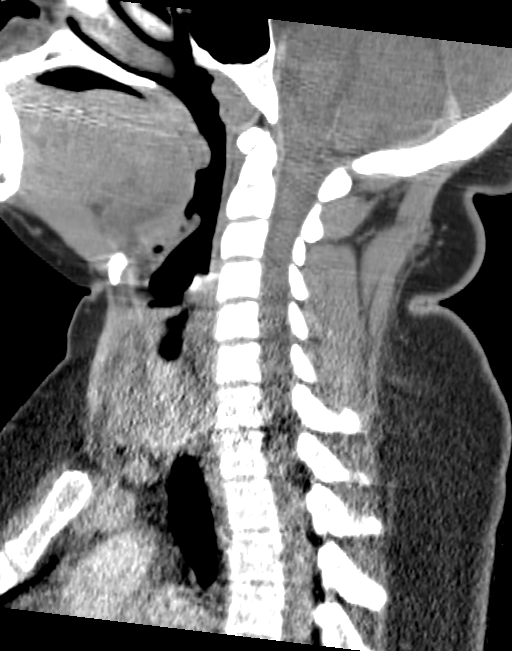
[im 49/98  bone]
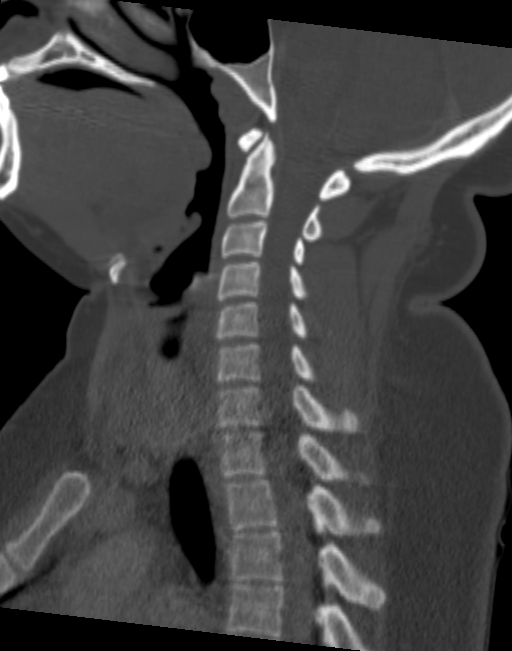
[im 57/98  bone]
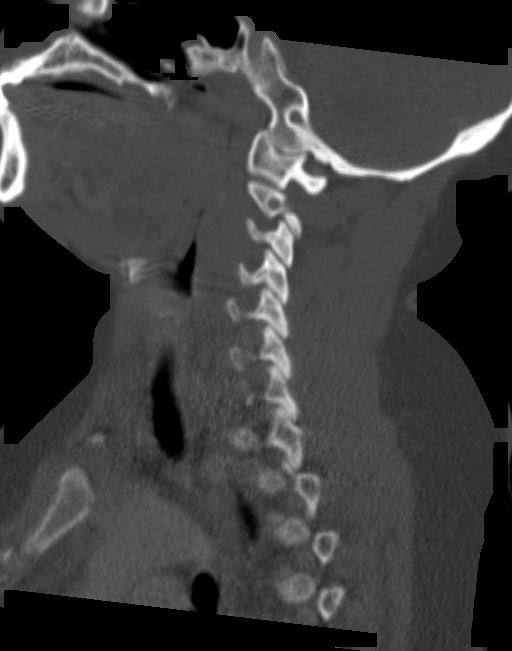
[im 65/98  bone]
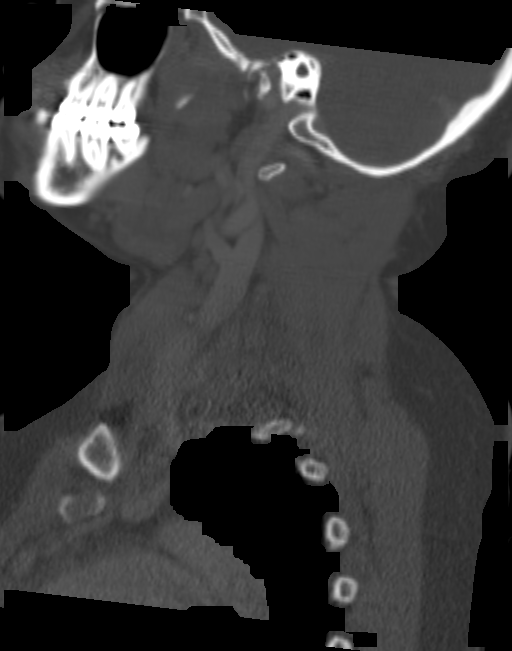

[Series 8: neck 2.0 coronal · coronal · 0.36mm/px · 3 of 98 slices shown]
[im 20/98  bone]
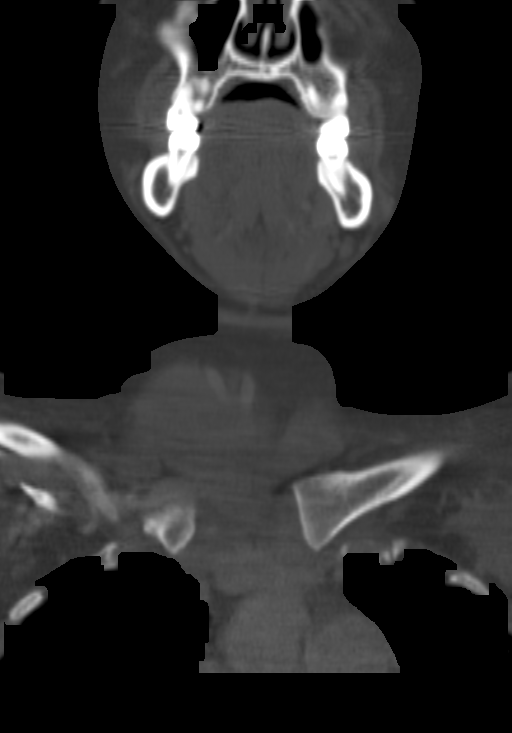
[im 39/98  bone]
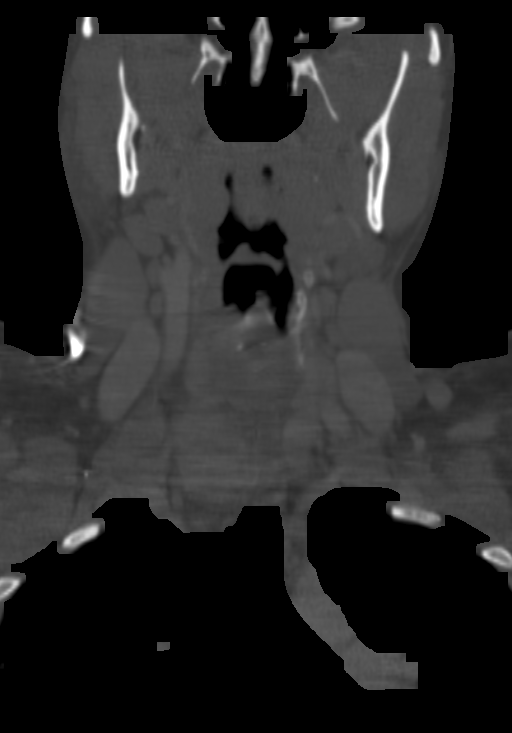
[im 59/98  bone]
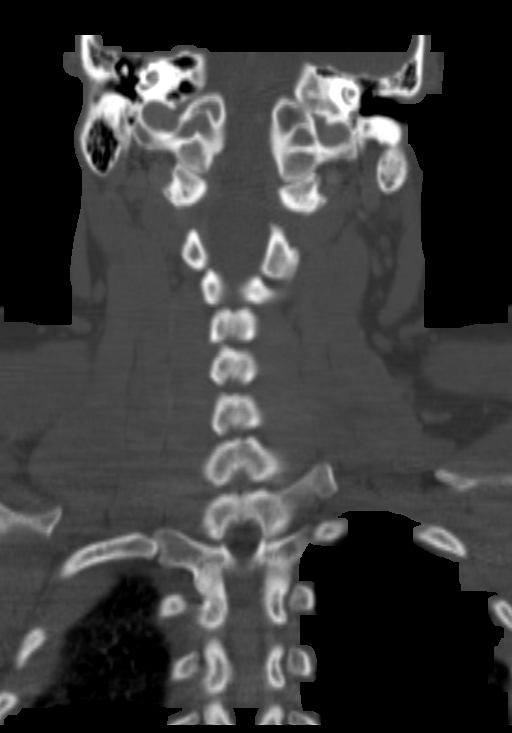

[13 of 33 positions shown; findings below may reference images not displayed]

FINDINGS: The parapharyngeal spaces appear normal and symmetric.
Small bilateral station II lymph nodes are not pathologically
enlarged by size criteria.  Small submandibular lymph nodes are
also present and may be reactive.  There is some incidental reflux
of injected contrast medium in the right external jugular vein.

There is right word tracheal deviation due to prominent enlargement
the right lobe of the thyroid gland, with central stellate low
density which could reflect a centrally necrotic thyroid nodule or
conceivably an inflammatory process in the right thyroid gland.
The right lobe measures 4.3 cm transversely, as compared to 1.6 cm
in the left lobe on image 77 of series 2, and the abnormal
prominence extends over a 5 cm craniocaudad extent.

No prevertebral soft tissue swelling is identified.  The epiglottis
and aryepiglottic folds appear normal.

No periapical abscess or overt dental abnormality is identified.
IMPRESSION: 1.  Prominent enlargement the right thyroid lobe with stellate
internal low density.  Although surrounding tissue planes are not
overtly obscured, this could be a manifestation of early
suppurative thyroiditis given the patient's right neck pain and
swelling.  A large right thyroid nodule is a differential
diagnostic consideration.  A drainable abscess is not currently
shown.
2.  Scattered small reactive lymph nodes are present in the neck.

## 2013-07-18 IMAGING — US US SOFT TISSUE HEAD/NECK
1 series · 14 of 25 positions shown · non-contrast
Comparison: CT of the neck dated 02/14/2011

CLINICAL DATA: Right thyroid enlargement by CT.

THYROID ULTRASOUND
TECHNIQUE: Ultrasound examination of the thyroid gland and adjacent
soft tissues was performed.

[Series 1: us soft tissue head/neck · 0.11mm/px · 14 of 35 slices shown]
[im 1/35]
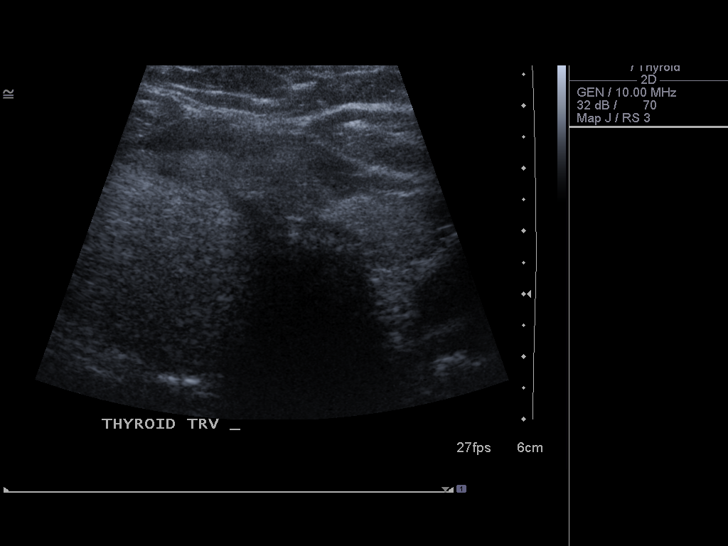
[im 3/35]
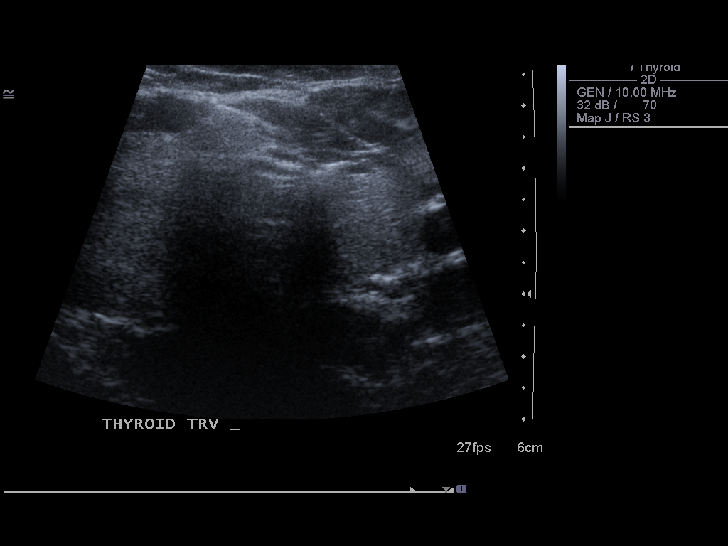
[im 6/35]
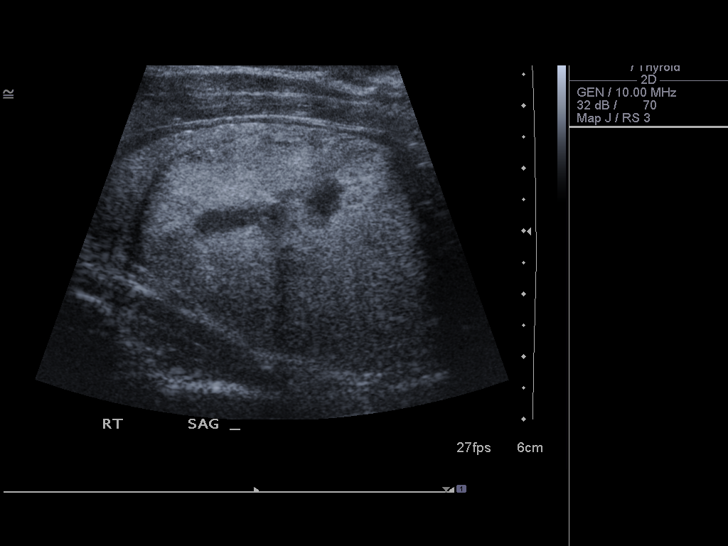
[im 9/35]
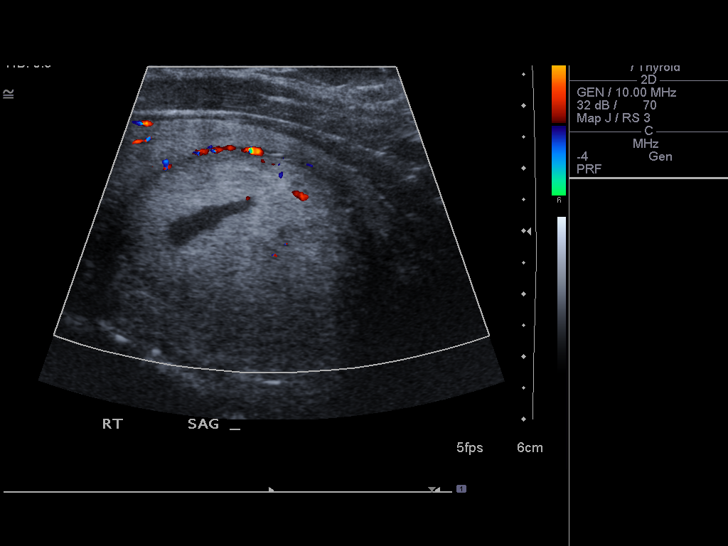
[im 12/35]
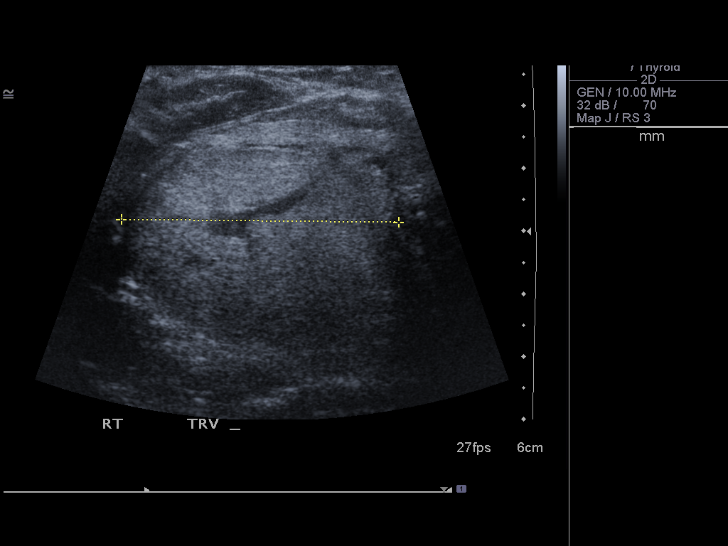
[im 13/35]
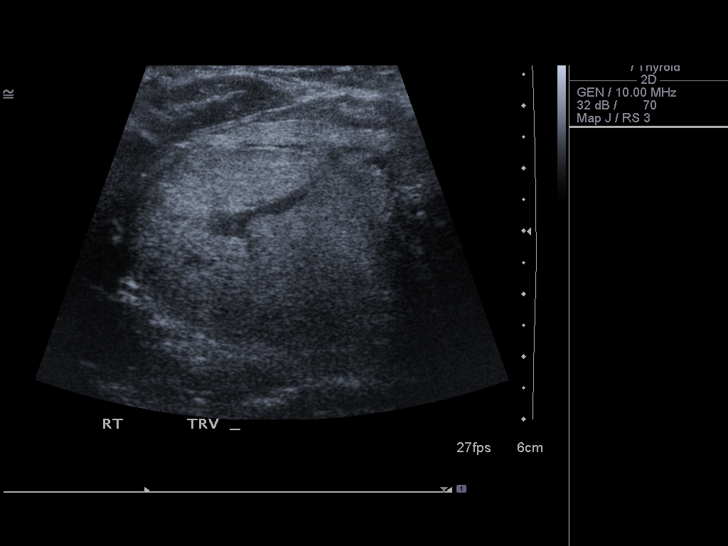
[im 16/35]
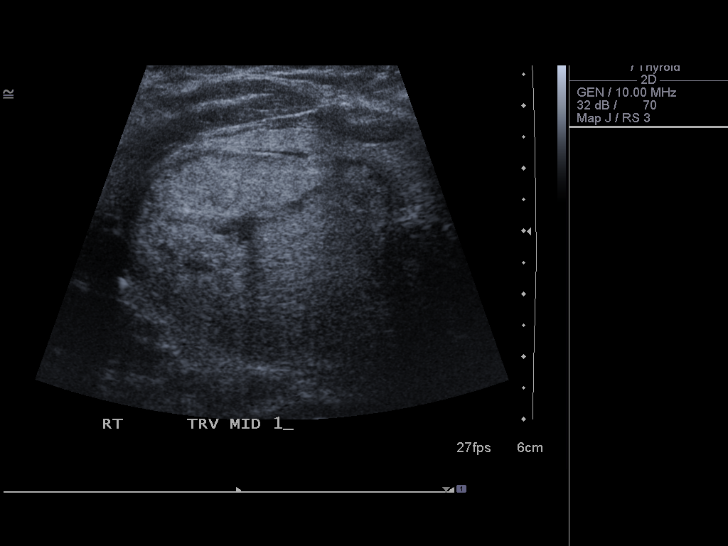
[im 19/35]
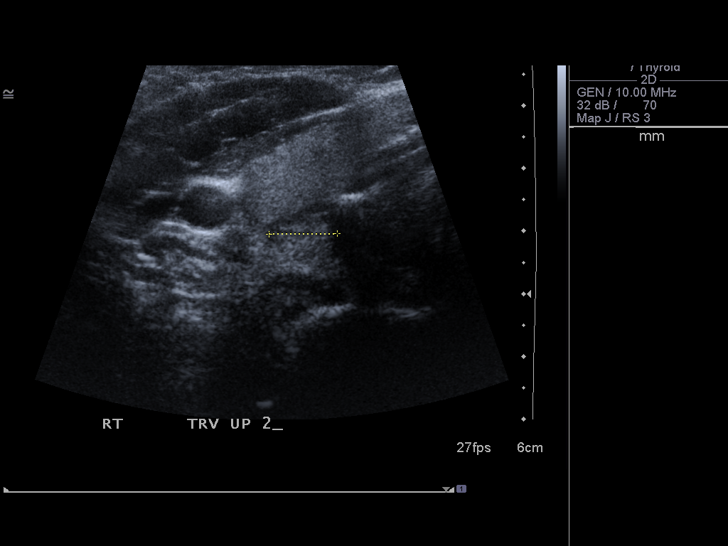
[im 22/35]
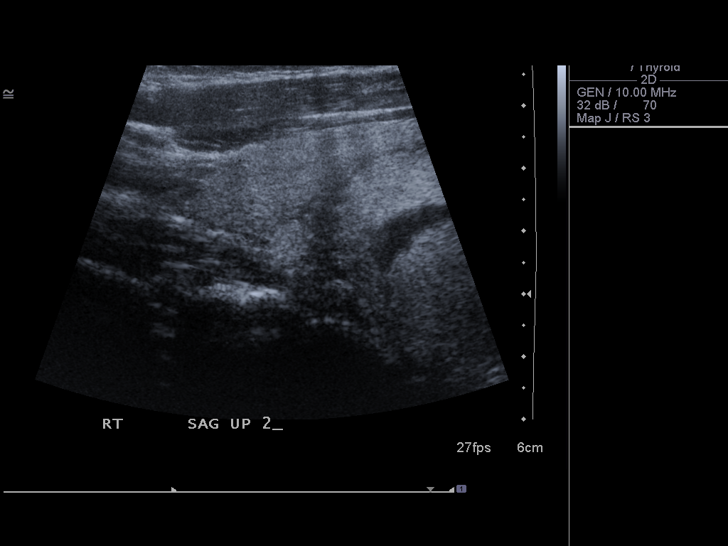
[im 23/35]
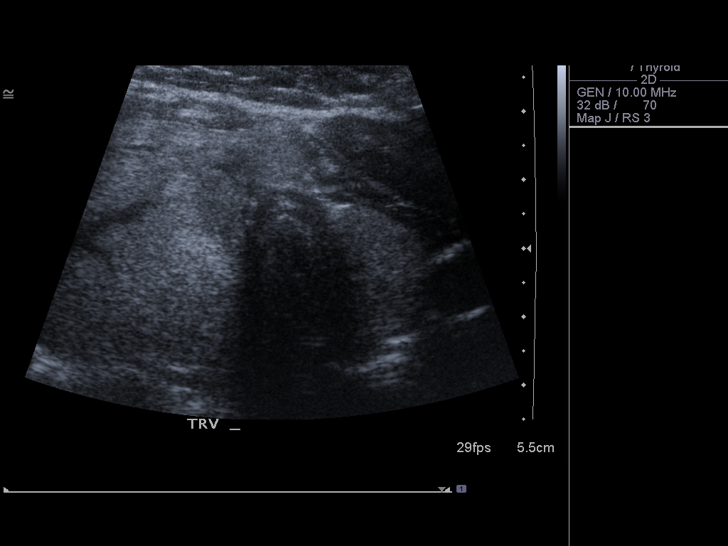
[im 26/35]
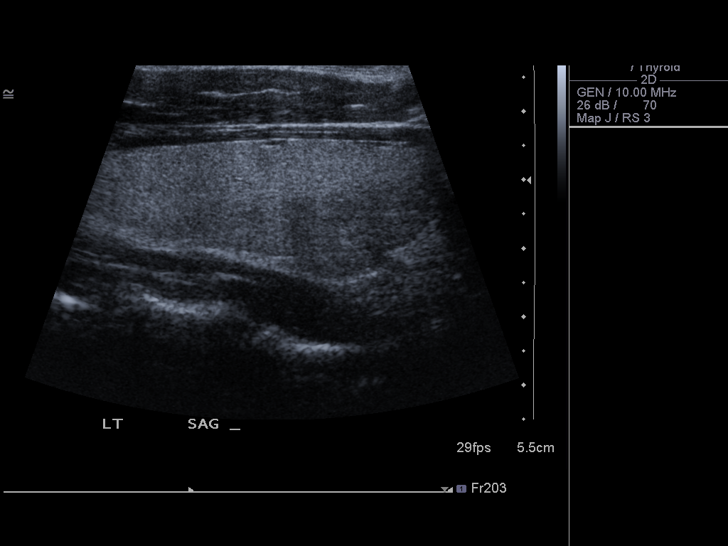
[im 29/35]
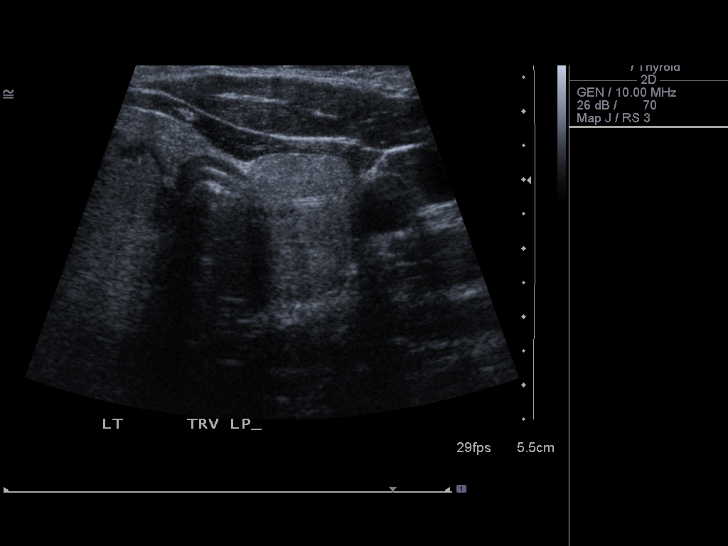
[im 32/35]
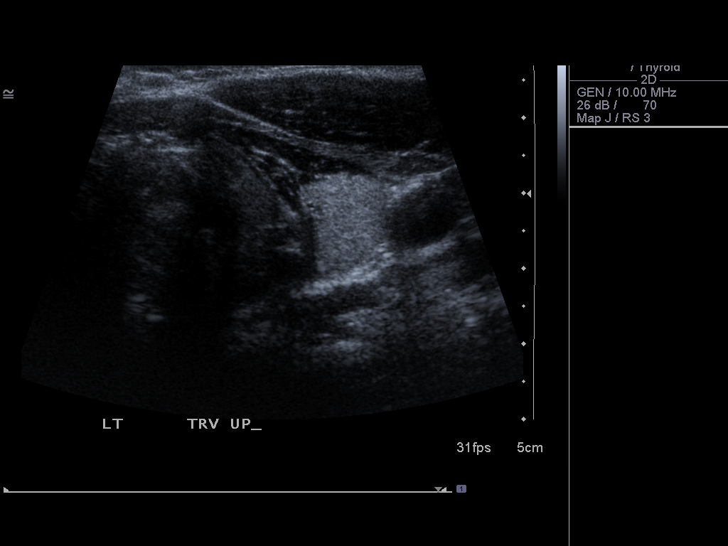
[im 35/35]
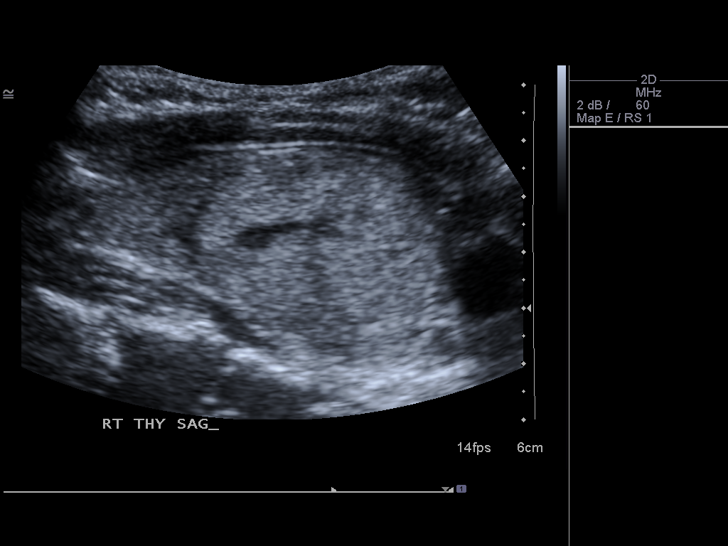

[14 of 25 positions shown; findings below may reference images not displayed]

FINDINGS: Right thyroid lobe:  6.4 x 4.0 x 4.4 cm
Left thyroid lobe:  5.6 x 2.3 x 1.7 cm
Isthmus:  0.7 cm

Focal nodules:  Dominant solid mass within the mid to lower aspect
of the right lobe of the thyroid gland measures approximately 4.5 x
3.4 x 4.0 cm and is predominately solid with central area of cystic
change/degeneration.  The lesion is not calcified and does not
demonstrate significant increased peripheral vascularity.  No other
thyroid lesions.  A small area measured at 0.7 cm in the superior
right lobe does not appear to represent a discrete nodule.

Lymphadenopathy:  None visualized.
IMPRESSION: 4.5 cm predominately solid mass within the right lobe of the
thyroid gland.
